# Patient Record
Sex: Female | Born: 1955 | Race: White | Hispanic: No | Marital: Married | Smoking: Current every day smoker
Health system: Southern US, Community
[De-identification: ages and names within clinical notes are randomized; demographics above are authoritative.]

## PROBLEM LIST (undated history)

## (undated) DIAGNOSIS — Z5189 Encounter for other specified aftercare: Secondary | ICD-10-CM

## (undated) DIAGNOSIS — F329 Major depressive disorder, single episode, unspecified: Secondary | ICD-10-CM

## (undated) DIAGNOSIS — M779 Enthesopathy, unspecified: Secondary | ICD-10-CM

## (undated) DIAGNOSIS — G4700 Insomnia, unspecified: Secondary | ICD-10-CM

## (undated) DIAGNOSIS — R55 Syncope and collapse: Secondary | ICD-10-CM

## (undated) DIAGNOSIS — K219 Gastro-esophageal reflux disease without esophagitis: Secondary | ICD-10-CM

## (undated) DIAGNOSIS — E785 Hyperlipidemia, unspecified: Secondary | ICD-10-CM

## (undated) DIAGNOSIS — Z86010 Personal history of colonic polyps: Secondary | ICD-10-CM

## (undated) DIAGNOSIS — F988 Other specified behavioral and emotional disorders with onset usually occurring in childhood and adolescence: Secondary | ICD-10-CM

## (undated) DIAGNOSIS — F419 Anxiety disorder, unspecified: Secondary | ICD-10-CM

## (undated) DIAGNOSIS — N611 Abscess of the breast and nipple: Secondary | ICD-10-CM

## (undated) DIAGNOSIS — I491 Atrial premature depolarization: Secondary | ICD-10-CM

## (undated) DIAGNOSIS — F32A Depression, unspecified: Secondary | ICD-10-CM

## (undated) DIAGNOSIS — H269 Unspecified cataract: Secondary | ICD-10-CM

## (undated) HISTORY — DX: Other specified behavioral and emotional disorders with onset usually occurring in childhood and adolescence: F98.8

## (undated) HISTORY — DX: Encounter for other specified aftercare: Z51.89

## (undated) HISTORY — DX: Gastro-esophageal reflux disease without esophagitis: K21.9

## (undated) HISTORY — DX: Insomnia, unspecified: G47.00

## (undated) HISTORY — DX: Atrial premature depolarization: I49.1

## (undated) HISTORY — DX: Hyperlipidemia, unspecified: E78.5

## (undated) HISTORY — DX: Depression, unspecified: F32.A

## (undated) HISTORY — PX: OTHER SURGICAL HISTORY: SHX169

## (undated) HISTORY — DX: Abscess of the breast and nipple: N61.1

## (undated) HISTORY — PX: COLONOSCOPY: SHX174

## (undated) HISTORY — DX: Anxiety disorder, unspecified: F41.9

## (undated) HISTORY — DX: Personal history of colonic polyps: Z86.010

## (undated) HISTORY — DX: Unspecified cataract: H26.9

## (undated) HISTORY — DX: Major depressive disorder, single episode, unspecified: F32.9

## (undated) HISTORY — PX: DEBRIDEMENT TENNIS ELBOW: SHX1442

## (undated) HISTORY — PX: WRIST FRACTURE SURGERY: SHX121

---
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
## 1987-09-18 HISTORY — PX: ABDOMINAL HYSTERECTOMY: SHX81

## 1999-09-18 HISTORY — PX: OTHER SURGICAL HISTORY: SHX169

## 1999-09-18 HISTORY — PX: BREAST BIOPSY: SHX20

## 2000-05-21 ENCOUNTER — Other Ambulatory Visit: Admission: RE | Admit: 2000-05-21 | Discharge: 2000-05-21 | Payer: Self-pay | Admitting: Obstetrics and Gynecology

## 2000-06-26 ENCOUNTER — Encounter: Payer: Self-pay | Admitting: Surgery

## 2000-06-26 ENCOUNTER — Encounter: Admission: RE | Admit: 2000-06-26 | Discharge: 2000-06-26 | Payer: Self-pay | Admitting: Surgery

## 2000-06-26 ENCOUNTER — Other Ambulatory Visit: Admission: RE | Admit: 2000-06-26 | Discharge: 2000-06-26 | Payer: Self-pay | Admitting: Surgery

## 2000-06-26 ENCOUNTER — Encounter (INDEPENDENT_AMBULATORY_CARE_PROVIDER_SITE_OTHER): Payer: Self-pay | Admitting: Specialist

## 2002-02-05 ENCOUNTER — Other Ambulatory Visit: Admission: RE | Admit: 2002-02-05 | Discharge: 2002-02-05 | Payer: Self-pay | Admitting: Obstetrics and Gynecology

## 2004-08-18 ENCOUNTER — Ambulatory Visit (HOSPITAL_BASED_OUTPATIENT_CLINIC_OR_DEPARTMENT_OTHER): Admission: RE | Admit: 2004-08-18 | Discharge: 2004-08-18 | Payer: Self-pay | Admitting: Orthopedic Surgery

## 2004-12-08 ENCOUNTER — Other Ambulatory Visit: Admission: RE | Admit: 2004-12-08 | Discharge: 2004-12-08 | Payer: Self-pay | Admitting: Obstetrics and Gynecology

## 2007-01-17 ENCOUNTER — Encounter: Admission: RE | Admit: 2007-01-17 | Discharge: 2007-01-17 | Payer: Self-pay | Admitting: Internal Medicine

## 2008-07-11 ENCOUNTER — Emergency Department (HOSPITAL_COMMUNITY): Admission: EM | Admit: 2008-07-11 | Discharge: 2008-07-11 | Payer: Self-pay | Admitting: Emergency Medicine

## 2010-10-08 ENCOUNTER — Encounter (HOSPITAL_BASED_OUTPATIENT_CLINIC_OR_DEPARTMENT_OTHER): Payer: Self-pay | Admitting: Internal Medicine

## 2010-10-09 ENCOUNTER — Encounter: Payer: Self-pay | Admitting: Obstetrics and Gynecology

## 2011-02-02 NOTE — Op Note (Signed)
NAMECHARLES, NIESE              ACCOUNT NO.:  [id number]   MEDICAL RECORD NO.:  [id number]          PATIENT TYPE:  AMB   LOCATION:  [facility]                          FACILITY:  [facility]   PHYSICIAN:  Harvie Junior, M.D.   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   PRINCIPAL PROCEDURE:  Lateral epicondylar release with debridement of  lateral epicondyle.   SURGEON:  Harvie Junior, M.D.   ASSISTANT:  Marshia Ly, P.A.   ANESTHESIA:  General.   BRIEF HISTORY:  She is a 55 year old female with a long history of having  lateral epicondylitis.  We treated her in the office x4 with injection  therapy.  It worked at each time, but she has had recurrence of symptoms.  Because of continued complaints of symptoms, she was ultimately taken to the  operating room for a lateral epicondylar release.   PROCEDURE:  The patient was taken to the operating room, where after an  adequate level of anesthesia was obtained with a general anesthetic, the  patient was placed on the operating table and the right arm was prepped and  draped in the usual sterile fashion.  Following this, the arm was  exsanguinated, a blood pressure tourniquet inflated to 250 mmHg.  Following  this, a curved incision was made over the lateral epicondyle, subcutaneous  tissues taken down to the level of the conjoined tendon the lateral side.  The conjoined lateral tendon was released off the elbow and an osteotome was  used to remove the bone off the lateral epicondyle.  The dissolved tendinous  portion of the extensor carpi brevis was debrided.  The lateral joint was  opened and investigated and irrigated thoroughly.  No significant pathology  was identified.  At this point the  lateral epicondylar muscle structure was  reattached to the lateral epicondyle with a running 0 Vicryl suture.  The  skin was closed with 0 and 2-0 Vicryl and 3-0 Maxon.  Benzoin and Steri-  Strips were applied and the patient was placed into a long-arm plaster and  taken to the recovery room, where she was noted to be in satisfactory  condition.  Estimated blood loss for the procedure was none.       JLG/MEDQ  D:  08/18/2004  T:  08/19/2004  Job:  [id number]

## 2011-09-05 ENCOUNTER — Encounter: Payer: Self-pay | Admitting: Internal Medicine

## 2011-09-06 ENCOUNTER — Encounter (INDEPENDENT_AMBULATORY_CARE_PROVIDER_SITE_OTHER): Payer: Self-pay | Admitting: General Surgery

## 2011-09-13 ENCOUNTER — Encounter (INDEPENDENT_AMBULATORY_CARE_PROVIDER_SITE_OTHER): Payer: Self-pay | Admitting: Surgery

## 2011-09-13 ENCOUNTER — Ambulatory Visit (INDEPENDENT_AMBULATORY_CARE_PROVIDER_SITE_OTHER): Payer: BC Managed Care – PPO | Admitting: Surgery

## 2011-09-13 VITALS — BP 118/72 | HR 60 | Temp 98.1°F | Resp 16 | Ht 68.0 in | Wt 140.8 lb

## 2011-09-13 DIAGNOSIS — N611 Abscess of the breast and nipple: Secondary | ICD-10-CM

## 2011-09-13 DIAGNOSIS — N6002 Solitary cyst of left breast: Secondary | ICD-10-CM | POA: Insufficient documentation

## 2011-09-13 DIAGNOSIS — N61 Mastitis without abscess: Secondary | ICD-10-CM

## 2011-09-13 MED ORDER — DOXYCYCLINE HYCLATE 100 MG PO TABS: 100.0000 mg | ORAL_TABLET | Freq: Two times a day (BID) | ORAL | Status: AC

## 2011-09-13 NOTE — Progress Notes (Signed)
Subjective:     Patient ID: Meredith Morgan, female   DOB: 1956/05/31, 55 y.o.   MRN: [id number]  HPI  Meredith Morgan  1956-06-13 [id number]  Patient Care Team: Garlan Fillers, MD as PCP - General (Internal Medicine) Meriel Pica, MD as Consulting Physician (Obstetrics and Gynecology)  This patient is a 55 y.o.female who presents today for surgical evaluation at the request of Dr. Eloise Harman.   Patient is a pleasant female who has left breast pain & swelling at e inframammary fold for the past month.  It became about as big as a golf ball. She saw her primary care physician. He started her on doxycycline.  Just after that she began to get some drainage. There was some confusion on getting an appointment set up with [facility]. The drainage and swelling have gone down. However the past week it has not resolved. She came in urgently to be evaluated to see something more aggressive need be done. She's completed antibiotics. The drainage has stopped. She had multiple openings but now she just has one small opening now  She's not had a history of breast problems. No history of skin infections are MRSA. No family history of breast problems or cancer. No history of trauma or fall. No scratches  Patient Active Problem List  Diagnoses  . Breast abscess of female, left inframammary fold    Past Medical History  Diagnosis Date  . GERD (gastroesophageal reflux disease)   . Breast abscess     left    Past Surgical History  Procedure Date  . Abdominal hysterectomy 1989  . Debridement tennis elbow 2007?    left    History   Social History  . Marital Status: Single    Spouse Name: N/A    Number of Children: N/A  . Years of Education: N/A   Occupational History  . Not on file.   Social History Main Topics  . Smoking status: Current Everyday Smoker -- 1.0 packs/day  . Smokeless tobacco: Not on file  . Alcohol Use: 0.0 oz/week    3-4 Glasses of wine per week     socially  . Drug  Use: No  . Sexually Active:    Other Topics Concern  . Not on file   Social History Narrative  . No narrative on file    Family History  Problem Relation Age of Onset  . COPD Mother   . Cancer Father     lung    Current outpatient prescriptions:escitalopram (LEXAPRO) 10 MG tablet, Take 10 mg by mouth daily.  , Disp: , Rfl: ;  lamoTRIgine (LAMICTAL) 200 MG tablet, Take 200 mg by mouth daily.  , Disp: , Rfl: ;  methylphenidate (RITALIN) 20 MG tablet, Take 20 mg by mouth as needed.  , Disp: , Rfl: ;  omeprazole (PRILOSEC) 20 MG capsule, Take 20 mg by mouth daily.  , Disp: , Rfl:  doxycycline (VIBRA-TABS) 100 MG tablet, Take 1 tablet (100 mg total) by mouth 2 (two) times daily., Disp: 20 tablet, Rfl: 2  Allergies  Allergen Reactions  . Penicillins Rash    BP 118/72  Pulse 60  Temp(Src) 98.1 F (36.7 C) (Temporal)  Resp 16  Ht 5\' 8"  (1.727 m)  Wt 140 lb 12.8 oz (63.866 kg)  BMI 21.41 kg/m2    Review of Systems  Constitutional: Negative for fever, chills, diaphoresis, appetite change and fatigue.  HENT: Negative for ear pain, sore throat, trouble swallowing, neck pain and  ear discharge.   Eyes: Negative for photophobia, discharge and visual disturbance.  Respiratory: Negative for cough, choking, chest tightness and shortness of breath.   Cardiovascular: Negative for chest pain and palpitations.  Gastrointestinal: Negative for nausea, vomiting, abdominal pain, diarrhea, constipation, anal bleeding and rectal pain.  Genitourinary: Negative for dysuria, frequency and difficulty urinating.  Musculoskeletal: Negative for myalgias and gait problem.  Skin: Negative for color change, pallor and rash.  Neurological: Negative for dizziness, speech difficulty, weakness and numbness.  Hematological: Negative for adenopathy.  Psychiatric/Behavioral: Negative for confusion and agitation. The patient is not nervous/anxious.        Objective:   Physical Exam  Constitutional: She is  oriented to person, place, and time. She appears well-developed and well-nourished. No distress.  HENT:  Head: Normocephalic.  Mouth/Throat: Oropharynx is clear and moist. No oropharyngeal exudate.  Eyes: Conjunctivae and EOM are normal. Pupils are equal, round, and reactive to light. No scleral icterus.  Neck: Normal range of motion. No tracheal deviation present.  Cardiovascular: Normal rate and intact distal pulses.   Pulmonary/Chest: Effort normal. No respiratory distress. She exhibits no tenderness.    Abdominal: Soft. She exhibits no distension. There is no tenderness. Hernia confirmed negative in the right inguinal area and confirmed negative in the left inguinal area.  Musculoskeletal: Normal range of motion. She exhibits no tenderness.  Lymphadenopathy:       Right: No inguinal adenopathy present.       Left: No inguinal adenopathy present.  Neurological: She is alert and oriented to person, place, and time. No cranial nerve deficit. She exhibits normal muscle tone. Coordination normal.  Skin: Skin is warm and dry. No rash noted. She is not diaphoretic.  Psychiatric: She has a normal mood and affect. Her behavior is normal.       Assessment:     Probable breast abscess, left inframammary fold, not fully resolved    Plan:     Restart p.o. antibiotics (doxycycline x10days)  Add anti-inflammatory regimen with Aleve and heat  Return to clinic in a few weeks. If it has not resolved or is worsening she may need a biopsy or something else to rule out other concerns. She felt comfortable with that. If there is a diagnosis of cancer, she may need to see one on our Breast surgeons in the group.   I hope it will not come to that.  Stop smoking to help the healing process

## 2011-09-13 NOTE — Patient Instructions (Addendum)
Mastitis / Abscess Mastitis is a breast infection that is results in pain, puffiness (swelling), redness, and warmth of the breast. Germs cause mastitis and can enter the skin through:  Breastfeeding.   Nipple piercing.   Cracks in the skin of the breast.  HOME CARE  Take all medicine as told by your doctor. An antibiotic medicine to kill the infection may be prescribed.   Keep your nipples clean and dry if you breastfeed. You may have you stop breastfeeding until the breast infection has gone away.   Do not use one breast to nurse your baby. Switch breasts when you breastfeed. Use different positions to breastfeed.   Avoid letting your breasts get overly filled with milk (engorged). Use a breast pump to empty your breasts.   Do not wear tight-fitting bras. Wear a good support bra.   A breastfeeding specialist (lactation consultant) can give you helpful tips on breastfeeding.  GET HELP RIGHT AWAY IF:  Your breast starts leaking a yellow or tan fluid.   The pain, puffiness, or redness in your breast gets worse.   You have a fever.  MAKE SURE YOU:   Understand these instructions.   Will watch your condition.   Will get help right away if you are not doing well or get worse.  Document Released: 08/22/2009 Document Revised: 05/16/2011 Document Reviewed: 08/22/2009 [facility] Patient Information 2012 [place], [place].  Smoking Cessation, Tips for Success YOU CAN QUIT SMOKING If you are ready to quit smoking, congratulations! You have chosen to help yourself be healthier. Cigarettes bring nicotine, tar, carbon monoxide, and other irritants into your body. Your lungs, heart, and blood vessels will be able to work better without these poisons. There are many different ways to quit smoking. Nicotine gum, nicotine patches, a nicotine inhaler, or nicotine nasal spray can help with physical craving. Hypnosis, support groups, and medicines help break the habit of smoking. Here are some tips  to help you quit for good.  Throw away all cigarettes.   Clean and remove all ashtrays from your home, work, and car.   On a card, write down your reasons for quitting. Carry the card with you and read it when you get the urge to smoke.   Cleanse your body of nicotine. Drink enough water and fluids to keep your urine clear or pale yellow. Do this after quitting to flush the nicotine from your body.   Learn to predict your moods. Do not let a bad situation be your excuse to have a cigarette. Some situations in your life might tempt you into wanting a cigarette.   Never have "just one" cigarette. It leads to wanting another and another. Remind yourself of your decision to quit.   Change habits associated with smoking. If you smoked while driving or when feeling stressed, try other activities to replace smoking. Stand up when drinking your coffee. Brush your teeth after eating. Sit in a different chair when you read the paper. Avoid alcohol while trying to quit, and try to drink fewer caffeinated beverages. Alcohol and caffeine may urge you to smoke.   Avoid foods and drinks that can trigger a desire to smoke, such as sugary or spicy foods and alcohol.   Ask people who smoke not to smoke around you.   Have something planned to do right after eating or having a cup of coffee. Take a walk or exercise to perk you up. This will help to keep you from overeating.   Try a relaxation  exercise to calm you down and decrease your stress. Remember, you may be tense and nervous for the first 2 weeks after you quit, but this will pass.   Find new activities to keep your hands busy. Play with a pen, coin, or rubber band. Doodle or draw things on paper.   Brush your teeth right after eating. This will help cut down on the craving for the taste of tobacco after meals. You can try mouthwash, too.   Use oral substitutes, such as lemon drops, carrots, a cinnamon stick, or chewing gum, in place of cigarettes.  Keep them handy so they are available when you have the urge to smoke.   When you have the urge to smoke, try deep breathing.   Designate your home as a nonsmoking area.   If you are a heavy smoker, ask your caregiver about a prescription for nicotine chewing gum. It can ease your withdrawal from nicotine.   Reward yourself. Set aside the cigarette money you save and buy yourself something nice.   Look for support from others. Join a support group or smoking cessation program. Ask someone at home or at work to help you with your plan to quit smoking.   Always ask yourself, "Do I need this cigarette or is this just a reflex?" Tell yourself, "Today, I choose not to smoke," or "I do not want to smoke." You are reminding yourself of your decision to quit, even if you do smoke a cigarette.  HOW WILL I FEEL WHEN I QUIT SMOKING?  The benefits of not smoking start within days of quitting.   You may have symptoms of withdrawal because your body is used to nicotine (the addictive substance in cigarettes). You may crave cigarettes, be irritable, feel very hungry, cough often, get headaches, or have difficulty concentrating.   The withdrawal symptoms are only temporary. They are strongest when you first quit but will go away within 10 to 14 days.   When withdrawal symptoms occur, stay in control. Think about your reasons for quitting. Remind yourself that these are signs that your body is healing and getting used to being without cigarettes.   Remember that withdrawal symptoms are easier to treat than the major diseases that smoking can cause.   Even after the withdrawal is over, expect periodic urges to smoke. However, these cravings are generally short-lived and will go away whether you smoke or not. Do not smoke!   If you relapse and smoke again, do not lose hope. Most smokers quit 3 times before they are successful.   If you relapse, do not give up! Plan ahead and think about what you will do  the next time you get the urge to smoke.  LIFE AS A NONSMOKER: MAKE IT FOR A MONTH, MAKE IT FOR LIFE Day 1: Hang this page where you will see it every day. Day 2: Get rid of all ashtrays, matches, and lighters. Day 3: Drink water. Breathe deeply between sips. Day 4: Avoid places with smoke-filled air, such as bars, clubs, or the smoking section of restaurants. Day 5: Keep track of how much money you save by not smoking. Day 6: Avoid boredom. Keep a good book with you or go to the movies. Day 7: Reward yourself! One week without smoking! Day 8: Make a dental appointment to get your teeth cleaned. Day 9: Decide how you will turn down a cigarette before it is offered to you. Day 10: Review your reasons for quitting. Day 11:  Distract yourself. Stay active to keep your mind off smoking and to relieve tension. Take a walk, exercise, read a book, do a crossword puzzle, or try a new hobby. Day 12: Exercise. Get off the bus before your stop or use stairs instead of escalators. Day 13: Call on friends for support and encouragement. Day 14: Reward yourself! Two weeks without smoking! Day 15: Practice deep breathing exercises. Day 16: Bet a friend that you can stay a nonsmoker. Day 17: Ask to sit in nonsmoking sections of restaurants. Day 18: Hang up "No Smoking" signs. Day 19: Think of yourself as a nonsmoker. Day 20: Each morning, tell yourself you will not smoke. Day 21: Reward yourself! Three weeks without smoking! Day 22: Think of smoking in negative ways. Remember how it stains your teeth, gives you bad breath, and leaves you short of breath. Day 23: Eat a nutritious breakfast. Day 24:Do not relive your days as a smoker. Day 25: Hold a pencil in your hand when talking on the telephone. Day 26: Tell all your friends you do not smoke. Day 27: Think about how much better food tastes. Day 28: Remember, one cigarette is one too many. Day 29: Take up a hobby that will keep your hands busy. Day 30:  Congratulations! One month without smoking! Give yourself a big reward. Your caregiver can direct you to community resources or hospitals for support, which may include:  Group support.   Education.   Hypnosis.   Subliminal therapy.  Document Released: 06/01/2004 Document Revised: 05/16/2011 Document Reviewed: 06/20/2009 [facility] Patient Information 2012 [place], [place].

## 2011-10-01 ENCOUNTER — Telehealth (INDEPENDENT_AMBULATORY_CARE_PROVIDER_SITE_OTHER): Payer: Self-pay

## 2011-10-01 NOTE — Telephone Encounter (Signed)
LMOM for pt to call me b/c I need to r/s her appt with Dr Michaell Cowing from 10-02-11 to the next day per DR Gross./ [facility]

## 2011-10-02 ENCOUNTER — Encounter (INDEPENDENT_AMBULATORY_CARE_PROVIDER_SITE_OTHER): Payer: BC Managed Care – PPO | Admitting: Surgery

## 2011-10-02 ENCOUNTER — Telehealth (INDEPENDENT_AMBULATORY_CARE_PROVIDER_SITE_OTHER): Payer: Self-pay

## 2011-10-02 NOTE — Telephone Encounter (Signed)
LMOM at home and cell that I need for pt to arrive for her appt with Dr Michaell Cowing on 10/03/11 @ 11:45 b/c DR Michaell Cowing has surgery before his clinic.

## 2011-10-03 ENCOUNTER — Encounter (INDEPENDENT_AMBULATORY_CARE_PROVIDER_SITE_OTHER): Payer: BC Managed Care – PPO | Admitting: Surgery

## 2011-10-03 ENCOUNTER — Ambulatory Visit: Payer: Self-pay | Admitting: Internal Medicine

## 2011-10-03 ENCOUNTER — Telehealth (INDEPENDENT_AMBULATORY_CARE_PROVIDER_SITE_OTHER): Payer: Self-pay

## 2011-10-03 NOTE — Telephone Encounter (Signed)
LMOM for pt stating that I did r/s her appt for next wk on 10-10-11 @8 :30 but if this is not convienent for her to have me overhead paged so I can find a time that is good for her.

## 2011-10-10 ENCOUNTER — Encounter (INDEPENDENT_AMBULATORY_CARE_PROVIDER_SITE_OTHER): Payer: Self-pay | Admitting: Surgery

## 2011-10-10 ENCOUNTER — Ambulatory Visit (INDEPENDENT_AMBULATORY_CARE_PROVIDER_SITE_OTHER): Payer: BC Managed Care – PPO | Admitting: Surgery

## 2011-10-10 VITALS — BP 102/72 | HR 62 | Temp 97.8°F | Resp 16 | Ht 68.0 in | Wt 144.0 lb

## 2011-10-10 DIAGNOSIS — N6009 Solitary cyst of unspecified breast: Secondary | ICD-10-CM

## 2011-10-10 DIAGNOSIS — N6002 Solitary cyst of left breast: Secondary | ICD-10-CM

## 2011-10-10 NOTE — Progress Notes (Signed)
Subjective:     Patient ID: Meredith Morgan, female   DOB: 1956-08-20, 56 y.o.   MRN: [id number]  HPI   ALEXAH KIVETT  08/10/1956 [id number]  Patient Care Team: Garlan Fillers, MD as PCP - General (Internal Medicine) Meriel Pica, MD as Consulting Physician (Obstetrics and Gynecology)  This patient is a 56 y.o.female who presents today for surgical evaluation at the request of Dr. Eloise Harman.   Patient is a pleasant female who has left breast pain & swelling at the inframammary fold for the past month.  It became about as big as a golf ball. She saw her primary care physician. He started her on doxycycline.  Just after that she began to get some drainage. There was some confusion on getting an appointment set up with [facility]. The drainage and swelling have gone down. However the past week it has not resolved. She came in urgently to be evaluated to see something more aggressive need be done. She's completed antibiotics. The drainage has stopped. She had multiple openings but now she just has one small opening now  She's not had a history of breast problems. No history of skin infections are MRSA. No family history of breast problems or cancer. No history of trauma or fall. No scratches.  She feels better now.  Completed antibiotics.  No more drainage.  Swelling is down.  Patient Active Problem List  Diagnoses  . Cyst of breast, left, inframammary fold    Past Medical History  Diagnosis Date  . GERD (gastroesophageal reflux disease)   . Breast abscess     left    Past Surgical History  Procedure Date  . Abdominal hysterectomy 1989  . Debridement tennis elbow 2007?    left    History   Social History  . Marital Status: Single    Spouse Name: N/A    Number of Children: N/A  . Years of Education: N/A   Occupational History  . Not on file.   Social History Main Topics  . Smoking status: Current Everyday Smoker -- 1.0 packs/day  . Smokeless tobacco: Never Used  .  Alcohol Use: 0.0 oz/week    3-4 Glasses of wine per week     socially  . Drug Use: No  . Sexually Active: Not on file   Other Topics Concern  . Not on file   Social History Narrative  . No narrative on file    Family History  Problem Relation Age of Onset  . COPD Mother   . Cancer Father     lung    Current outpatient prescriptions:escitalopram (LEXAPRO) 10 MG tablet, Take 10 mg by mouth daily.  , Disp: , Rfl: ;  lamoTRIgine (LAMICTAL) 200 MG tablet, Take 200 mg by mouth daily.  , Disp: , Rfl: ;  methylphenidate (RITALIN) 20 MG tablet, Take 20 mg by mouth as needed.  , Disp: , Rfl: ;  omeprazole (PRILOSEC) 20 MG capsule, Take 20 mg by mouth daily.  , Disp: , Rfl:   Allergies  Allergen Reactions  . Penicillins Rash    BP 102/72  Pulse 62  Temp(Src) 97.8 F (36.6 C) (Temporal)  Resp 16  Ht 5\' 8"  (1.727 m)  Wt 144 lb (65.318 kg)  BMI 21.90 kg/m2    Review of Systems  Constitutional: Negative for fever, chills, diaphoresis, appetite change and fatigue.  HENT: Negative for ear pain, sore throat, trouble swallowing, neck pain and ear discharge.   Eyes: Negative for  photophobia, discharge and visual disturbance.  Respiratory: Negative for cough, choking, chest tightness and shortness of breath.   Cardiovascular: Negative for chest pain and palpitations.  Gastrointestinal: Negative for nausea, vomiting, abdominal pain, diarrhea, constipation, anal bleeding and rectal pain.  Genitourinary: Negative for dysuria, frequency and difficulty urinating.  Musculoskeletal: Negative for myalgias and gait problem.  Skin: Negative for color change, pallor and rash.  Neurological: Negative for dizziness, speech difficulty, weakness and numbness.  Hematological: Negative for adenopathy.  Psychiatric/Behavioral: Negative for confusion and agitation. The patient is not nervous/anxious.        Objective:   Physical Exam  Constitutional: She is oriented to person, place, and time. She  appears well-developed and well-nourished. No distress.  HENT:  Head: Normocephalic.  Mouth/Throat: Oropharynx is clear and moist. No oropharyngeal exudate.  Eyes: Conjunctivae and EOM are normal. Pupils are equal, round, and reactive to light. No scleral icterus.  Neck: Normal range of motion. No tracheal deviation present.  Cardiovascular: Normal rate and intact distal pulses.   Pulmonary/Chest: Effort normal. No respiratory distress. She exhibits no tenderness.    Abdominal: Soft. She exhibits no distension. There is no tenderness. Hernia confirmed negative in the right inguinal area and confirmed negative in the left inguinal area.  Musculoskeletal: Normal range of motion. She exhibits no tenderness.  Lymphadenopathy:       Right: No inguinal adenopathy present.       Left: No inguinal adenopathy present.  Neurological: She is alert and oriented to person, place, and time. No cranial nerve deficit. She exhibits normal muscle tone. Coordination normal.  Skin: Skin is warm and dry. No rash noted. She is not diaphoretic.  Psychiatric: She has a normal mood and affect. Her behavior is normal.       Assessment:     Probable breast abscess/cyst, left inframammary fold, resolving    Plan:     Anti-inflammatory regimen with Aleve and heat  Return to clinic in a few weeks. If it has not resolved or is worsening she may need excisional biopsy or something else to rule out other concerns. She felt comfortable with that. If there is a diagnosis of cancer, she may need to see one on our Breast surgeons in the group.   I doubt it will not come to that.  D/w my partner, Dr. Magnus Ivan whom would assume care should more aggressive therapy need be involved  Stop smoking to help the healing process

## 2011-10-10 NOTE — Patient Instructions (Signed)
Breast Cyst A breast cyst is a sac in your breast that is filled with fluid. This is common in women. Women can have one or many cysts. When the breasts contain many cysts, it is usually due to a noncancerous (benign) condition called fibrocystic change. These lumps form under the influence of female hormones (estrogen and progesterone). The lumps are most often located in the upper, outer portion of the breast. They are often more swollen, painful and tender before your period starts. They usually disappear after menopause, unless you are on hormone therapy. Different types of cysts:  Macrocysts. These are cysts that are about 2 inches in diameter.   Microcysts. These are tiny cysts that you cannot feel, but that are seen with a mammogram or an ultrasound.   Galactocele. This is a cyst containing milk, which develops when and if you suddenly stop breast-feeding.   Sebaceous cyst of the skin (not in the breast tissue itself). These are not cancerous.  Breast cysts do not increase your chance of getting breast cancer. However, they must be followed and treated closely, because a cyst can be cancerous. Be sure to see your caregiver for follow up care as recommended.  CAUSES   It is not completely known what causes a breast cyst.   Estrogen may influence the development of a breast cyst.   An overgrowth of milk glands and connective tissue in the breast can block the milk glands, causing them to fill with fluid.   Scar tissue in the breast from previous surgery may block the glands, causing a cyst.  SYMPTOMS   Feeling a smooth, round, soft lump (like a grape) in the breast that is easily moveable.   Breast discomfort or pain, especially in the area of the cyst.   Increase in size of the lump before your menstrual period, and decrease in size after your menstrual period.  DIAGNOSIS   The cyst can be felt during an exam by your caregiver.   Mammogram (breast X-ray).   Ultrasound.    Removing fluid from the cyst with a needle (fine needle aspiration).  TREATMENT   Your caregiver may feel there is no reason for treatment. He/she may watch to see if it goes away on its own.   Hormone treatment.   Needle aspiration. There is a 40% chance of the cyst recurring after aspiration.   Surgery to remove the whole cyst.  HOME CARE INSTRUCTIONS   Get a yearly exam by your caregiver, including a breast exam.   Do monthly self breast exams 5 to 7 days after your menstrual period.   Get mammogram tests as directed by your caregiver.   Only take over-the-counter pain medication as directed by your caregiver.   Wear a good support bra, especially when exercising.   Avoid caffeine.   Reduce your salt intake, especially before your menstrual period. Too much salt can cause fluid retention, breast swelling, and discomfort.  SEEK MEDICAL CARE IF:   You feel, or think you feel, a lump in your breast.   You notice that both breasts look different than usual.   You notice that both breasts feel different than before.   Your breast is still causing pain, after your menstrual period is over.   You need medicine for breast pain and swelling that occurs with your menstrual period.  SEEK IMMEDIATE MEDICAL CARE IF:   You develop severe pain, tenderness, redness, or heat in your breast.   You develop nipple discharge or  bleeding.   Your breast lump becomes hard and painful.   You find new lumps or bumps that were not there before.   You feel lumps in your armpit (axilla).   You notice dimpling or wrinkling of the breast or nipple.   You have a fever.  Document Released: 09/03/2005 Document Revised: 05/16/2011 Document Reviewed: 12/24/2008 [facility] Patient Information 2012 [place], [place].

## 2011-10-16 ENCOUNTER — Encounter: Payer: Self-pay | Admitting: Internal Medicine

## 2011-10-24 ENCOUNTER — Telehealth: Payer: Self-pay | Admitting: Internal Medicine

## 2011-10-24 ENCOUNTER — Ambulatory Visit: Payer: Self-pay | Admitting: Internal Medicine

## 2011-11-06 ENCOUNTER — Encounter (INDEPENDENT_AMBULATORY_CARE_PROVIDER_SITE_OTHER): Payer: BC Managed Care – PPO | Admitting: Surgery

## 2011-11-13 ENCOUNTER — Encounter: Payer: Self-pay | Admitting: Internal Medicine

## 2011-11-13 ENCOUNTER — Ambulatory Visit (INDEPENDENT_AMBULATORY_CARE_PROVIDER_SITE_OTHER): Payer: Self-pay | Admitting: Internal Medicine

## 2011-11-13 ENCOUNTER — Other Ambulatory Visit (INDEPENDENT_AMBULATORY_CARE_PROVIDER_SITE_OTHER): Payer: Self-pay

## 2011-11-13 VITALS — BP 100/60 | HR 80 | Ht 69.0 in | Wt 141.2 lb

## 2011-11-13 DIAGNOSIS — R198 Other specified symptoms and signs involving the digestive system and abdomen: Secondary | ICD-10-CM

## 2011-11-13 DIAGNOSIS — R197 Diarrhea, unspecified: Secondary | ICD-10-CM

## 2011-11-13 DIAGNOSIS — Z1211 Encounter for screening for malignant neoplasm of colon: Secondary | ICD-10-CM

## 2011-11-13 MED ORDER — PEG-KCL-NACL-NASULF-NA ASC-C 100 G PO SOLR: 1.0000 | Freq: Once | ORAL | Status: DC

## 2011-11-13 NOTE — Progress Notes (Signed)
HISTORY OF PRESENT ILLNESS:  Meredith Morgan is a 56 y.o. female with anxiety/depression who presents today, upon referral from Dr. Eloise Harman, regarding change in bowel habits in the need for screening colonoscopy. She denies a prior history of GI problems or GI evaluations. Historically, she describes her bowels are constipated. However, over the past 2 years she's had intermittent problems with "severe diarrhea". Initially, she would have bouts of diarrhea but had no issues for several months. Over the past year, she reports bouts of diarrhea 2-3 times per week. There is urgency, but no pain. No bleeding. No weight loss. She takes up to 5 Imodium per day with some relief. The problem is not triggered by meals or stress. No particular time of day. No nocturnal component. She describes the stools as foul-smelling. There has been increased intestinal gas. No new medications or dietary change. No family history of gastrointestinal disorders. Her health record states that she has GERD and is on Prilosec. However, she tells me that she was having issues with cough and GERD was considered. TPI recommended. She is not taking this regularly. No reflux symptoms.  REVIEW OF SYSTEMS:  All non-GI ROS negative except for anxiety and depression  Past Medical History  Diagnosis Date  . GERD (gastroesophageal reflux disease)   . Breast abscess     left  . Hyperlipemia   . Insomnia   . Anxiety   . Depression   . ADD (attention deficit disorder)     Past Surgical History  Procedure Date  . Abdominal hysterectomy 1989  . Debridement tennis elbow 2007?    left  . Thyroid cyst excision 1970  . Liver laceration 1976  . Nasal stenosis repair 1976  . Breast biopsy 2001  . Lasik 2001    Social History RICHELE STRAND  reports that she has been smoking.  She has never used smokeless tobacco. She reports that she drinks alcohol. She reports that she does not use illicit drugs.  family history includes COPD  in her mother; Heart disease in an unspecified family member; and Lung cancer in her father.  Allergies  Allergen Reactions  . Penicillins Rash       PHYSICAL EXAMINATION: Vital signs: BP 100/60  Pulse 80  Ht 5\' 9"  (1.753 m)  Wt 141 lb 3.2 oz (64.048 kg)  BMI 20.85 kg/m2  Constitutional: generally well-appearing, no acute distress Psychiatric: alert and oriented x3, cooperative Eyes: extraocular movements intact, anicteric, conjunctiva pink Mouth: oral pharynx moist, no lesions Neck: supple no lymphadenopathy Cardiovascular: heart regular rate and rhythm, no murmur Lungs: clear to auscultation bilaterally Abdomen: soft, nontender, nondistended, no obvious ascites, no peritoneal signs, normal bowel sounds, no organomegaly Rectal:deferred until colonoscopy Extremities: no lower extremity edema bilaterally Skin: no lesions on visible extremities Neuro: No focal deficits.   ASSESSMENT:  #1. Change in bowel habits as manifested by intermittent diarrhea one to 2 years duration. Increased frequency with time. No alarm features such as pain, bleeding, or weight loss. #2. Colon cancer screening. Appropriate candidate without contraindication   PLAN:  #1. Tissue transglutaminase antibody and serum IgA level as a screening tool for celiac sprue #2. Schedule colonoscopy with biopsies to provide appropriate colon cancer screening and further evaluate chronic intermittent diarrhea.The nature of the procedure, as well as the risks, benefits, and alternatives were carefully and thoroughly reviewed with the patient. Ample time for discussion and questions allowed. The patient understood, was satisfied, and agreed to proceed. Movi prep prescribed. The patient instructed  on its use

## 2011-11-13 NOTE — Patient Instructions (Addendum)
You have been given a separate informational sheet regarding your tobacco use, the importance of quitting and local resources to help you quit.  Please go directly to the basement to have labs drawn.  You have been scheduled for a colonoscopy with propofol. Please follow written instructions given to you at your visit today.  Please pick up your prep kit at the pharmacy within the next 1-3 days.

## 2011-11-14 LAB — TISSUE TRANSGLUTAMINASE, IGA: Tissue Transglutaminase Ab, IgA: 2.1 U/mL (ref <20)

## 2011-11-28 ENCOUNTER — Ambulatory Visit (AMBULATORY_SURGERY_CENTER): Payer: BC Managed Care – PPO | Admitting: Internal Medicine

## 2011-11-28 ENCOUNTER — Encounter: Payer: Self-pay | Admitting: Internal Medicine

## 2011-11-28 VITALS — BP 130/65 | HR 70 | Temp 97.2°F | Resp 20

## 2011-11-28 DIAGNOSIS — K5730 Diverticulosis of large intestine without perforation or abscess without bleeding: Secondary | ICD-10-CM

## 2011-11-28 DIAGNOSIS — Z1211 Encounter for screening for malignant neoplasm of colon: Secondary | ICD-10-CM

## 2011-11-28 DIAGNOSIS — D126 Benign neoplasm of colon, unspecified: Secondary | ICD-10-CM

## 2011-11-28 DIAGNOSIS — R197 Diarrhea, unspecified: Secondary | ICD-10-CM

## 2011-11-28 MED ORDER — SODIUM CHLORIDE 0.9 % IV SOLN: 500.0000 mL | INTRAVENOUS | Status: DC

## 2011-11-28 NOTE — Progress Notes (Signed)
Propofol per l beeson crna. See scanned intra procedure report. ewm 

## 2011-11-28 NOTE — Patient Instructions (Signed)
YOU HAD AN ENDOSCOPIC PROCEDURE TODAY AT THE [facility]: Refer to the procedure report that was given to you for any specific questions about what was found during the examination.  If the procedure report does not answer your questions, please call your gastroenterologist to clarify.  If you requested that your care partner not be given the details of your procedure findings, then the procedure report has been included in a sealed envelope for you to review at your convenience later.  YOU SHOULD EXPECT: Some feelings of bloating in the abdomen. Passage of more gas than usual.  Walking can help get rid of the air that was put into your GI tract during the procedure and reduce the bloating. If you had a lower endoscopy (such as a colonoscopy or flexible sigmoidoscopy) you may notice spotting of blood in your stool or on the toilet paper. If you underwent a bowel prep for your procedure, then you may not have a normal bowel movement for a few days.  DIET: Your first meal following the procedure should be a light meal and then it is ok to progress to your normal diet.  A half-sandwich or bowl of soup is an example of a good first meal.  Heavy or fried foods are harder to digest and may make you feel nauseous or bloated.  Likewise meals heavy in dairy and vegetables can cause extra gas to form and this can also increase the bloating.  Drink plenty of fluids but you should avoid alcoholic beverages for 24 hours.  ACTIVITY: Your care partner should take you home directly after the procedure.  You should plan to take it easy, moving slowly for the rest of the day.  You can resume normal activity the day after the procedure however you should NOT DRIVE or use heavy machinery for 24 hours (because of the sedation medicines used during the test).    SYMPTOMS TO REPORT IMMEDIATELY: A gastroenterologist can be reached at any hour.  During normal business hours, 8:30 AM to 5:00 PM Monday through Friday,  call [phone].  After hours and on weekends, please call the GI answering service at [phone] who will take a message and have the physician on call contact you.   Following lower endoscopy (colonoscopy or flexible sigmoidoscopy):  Excessive amounts of blood in the stool  Significant tenderness or worsening of abdominal pains  Swelling of the abdomen that is new, acute  Fever of 100F or higher    FOLLOW UP: If any biopsies were taken you will be contacted by phone or by letter within the next 1-3 weeks.  Call your gastroenterologist if you have not heard about the biopsies in 3 weeks.  Our staff will call the home number listed on your records the next business day following your procedure to check on you and address any questions or concerns that you may have at that time regarding the information given to you following your procedure. This is a courtesy call and so if there is no answer at the home number and we have not heard from you through the emergency physician on call, we will assume that you have returned to your regular daily activities without incident.  SIGNATURES/CONFIDENTIALITY: You and/or your care partner have signed paperwork which will be entered into your electronic medical record.  These signatures attest to the fact that that the information above on your After Visit Summary has been reviewed and is understood.  Full responsibility of the confidentiality   of this discharge information lies with you and/or your care-partner.     

## 2011-11-28 NOTE — Op Note (Signed)
[facility] [address] [place], [place]  [address]  COLONOSCOPY PROCEDURE REPORT  PATIENT:  Meredith Morgan, Meredith Morgan  MR#:  [id number] BIRTHDATE:  [birth year], 55 yrs. old  GENDER:  female ENDOSCOPIST:  Wilhemina Bonito. Eda Keys, MD REF. BY:  Ivery Quale, M.D. PROCEDURE DATE:  11/28/2011 PROCEDURE:  Colonoscopy with biopsies, Colonoscopy with snare polypectomy x 2 ASA CLASS:  Class II INDICATIONS:  Routine Risk Screening, unexplained diarrhea MEDICATIONS:   MAC sedation, administered by CRNA, propofol (Diprivan) 200 mg IV  DESCRIPTION OF PROCEDURE:   After the risks benefits and alternatives of the procedure were thoroughly explained, informed consent was obtained.  Digital rectal exam was performed and revealed no abnormalities.   The LB 180AL [id number] endoscope was introduced through the anus and advanced to the cecum, which was identified by both the appendix and ileocecal valve, without limitations.  The quality of the prep was excellent, using MoviPrep.  The instrument was then slowly withdrawn as the colon was fully examined. <<PROCEDUREIMAGES>>  FINDINGS:  The terminal ileum appeared normal.  Two 2mm polyps were found in the sigmoid colon and snared without cautery. Retrieval was successful. Mild diverticulosis was found in the sigmoid colon.  Otherwise normal colonoscopy without other polyps, masses, vascular ectasias, or inflammatory changes.   Retroflexed views in the rectum revealed no abnormalities.    The time to cecum =  4:31  minutes. The scope was then withdrawn in 15:51 minutes from the cecum and the procedure completed.  COMPLICATIONS:  None  ENDOSCOPIC IMPRESSION: 1) Normal terminal ileum 2) Two tiny polyps in the sigmoid colon - removed 3) Mild diverticulosis in the sigmoid colon 4) Otherwise normal colonoscopy (random bx taken)  RECOMMENDATIONS: 1) Repeat colonoscopy in 5 years if polyp adenomatous; otherwise 10  years  ______________________________ Wilhemina Bonito. Eda Keys, MD  CC:  Jarome Matin, MD; The Patient  n. eSIGNED:   Wilhemina Bonito. Eda Keys at 11/28/2011 04:36 PM  Ilean China, [phone]

## 2011-11-28 NOTE — Progress Notes (Signed)
Patient did not experience any of the following events: a burn prior to discharge; a fall within the facility; wrong site/side/patient/procedure/implant event; or a hospital transfer or hospital admission upon discharge from the facility. (G8907) Patient did not have preoperative order for IV antibiotic SSI prophylaxis. (G8918)  

## 2011-11-29 ENCOUNTER — Telehealth: Payer: Self-pay | Admitting: *Deleted

## 2011-11-29 NOTE — Telephone Encounter (Signed)
  Follow up Call-  Call back number 11/28/2011  Post procedure Call Back phone  # [phone]  Permission to leave phone message Yes     Patient questions:  Do you have a fever, pain , or abdominal swelling? no Pain Score  0 *  Have you tolerated food without any problems? yes  Have you been able to return to your normal activities? yes  Do you have any questions about your discharge instructions: Diet   no Medications  no Follow up visit  no  Do you have questions or concerns about your Care? no  Actions: * If pain score is 4 or above: No action needed, pain <4.

## 2011-12-04 ENCOUNTER — Encounter: Payer: Self-pay | Admitting: Internal Medicine

## 2011-12-12 ENCOUNTER — Encounter (INDEPENDENT_AMBULATORY_CARE_PROVIDER_SITE_OTHER): Payer: Self-pay | Admitting: Surgery

## 2011-12-12 ENCOUNTER — Ambulatory Visit (INDEPENDENT_AMBULATORY_CARE_PROVIDER_SITE_OTHER): Payer: BC Managed Care – PPO | Admitting: Surgery

## 2011-12-12 VITALS — BP 96/64 | HR 66 | Temp 97.4°F | Resp 18 | Ht 69.0 in | Wt 139.6 lb

## 2011-12-12 DIAGNOSIS — F988 Other specified behavioral and emotional disorders with onset usually occurring in childhood and adolescence: Secondary | ICD-10-CM | POA: Insufficient documentation

## 2011-12-12 DIAGNOSIS — F419 Anxiety disorder, unspecified: Secondary | ICD-10-CM | POA: Insufficient documentation

## 2011-12-12 DIAGNOSIS — Z720 Tobacco use: Secondary | ICD-10-CM

## 2011-12-12 DIAGNOSIS — F17200 Nicotine dependence, unspecified, uncomplicated: Secondary | ICD-10-CM

## 2011-12-12 DIAGNOSIS — N6009 Solitary cyst of unspecified breast: Secondary | ICD-10-CM

## 2011-12-12 DIAGNOSIS — N6002 Solitary cyst of left breast: Secondary | ICD-10-CM

## 2011-12-12 DIAGNOSIS — Z860100 Personal history of colon polyps, unspecified: Secondary | ICD-10-CM

## 2011-12-12 DIAGNOSIS — G4700 Insomnia, unspecified: Secondary | ICD-10-CM | POA: Insufficient documentation

## 2011-12-12 DIAGNOSIS — K219 Gastro-esophageal reflux disease without esophagitis: Secondary | ICD-10-CM | POA: Insufficient documentation

## 2011-12-12 HISTORY — DX: Personal history of colon polyps, unspecified: Z86.0100

## 2011-12-12 HISTORY — DX: Personal history of colonic polyps: Z86.010

## 2011-12-12 NOTE — Progress Notes (Signed)
Subjective:     Patient ID: Meredith Morgan, female   DOB: July 03, 1956, 56 y.o.   MRN: [id number]  HPI   Meredith Morgan  [birth year] [id number]  Patient Care Team: Garlan Fillers, MD as PCP - General (Internal Medicine) Meriel Pica, MD as Consulting Physician (Obstetrics and Gynecology)  This patient is a 56 y.o.female who presents today for surgical evaluation at the request of Dr. Eloise Harman.   Patient is a pleasant female who has left breast pain & swelling at the inframammary fold for the past 3 months.  It became about as big as a golf ball. She saw her primary care physician. He started her on doxycycline.  Just after that she began to get some drainage. There was some confusion on getting an appointment set up with [facility]. The drainage and swelling have gone down. However the past week it has not resolved. She came in urgently to be evaluated to see something more aggressive need be done. She's completed antibiotics. The drainage has stopped. She had multiple openings but now she just has one small opening now  She's not had a history of breast problems. No history of skin infections are MRSA. No family history of breast problems or cancer. No history of trauma or fall. No scratches.  She feels better now.  Completed antibiotics.  No more drainage.  Swelling is down,  But the area has not completely healed.  Forms a head.  No other new lesions.  No new lumps.  Patient Active Problem List  Diagnoses  . Cyst of breast, left, inframammary fold    Past Medical History  Diagnosis Date  . GERD (gastroesophageal reflux disease)   . Breast abscess     left  . Hyperlipemia   . Insomnia   . Anxiety   . Depression   . ADD (attention deficit disorder)     Past Surgical History  Procedure Date  . Abdominal hysterectomy 1989  . Debridement tennis elbow 2007?    left  . Thyroid cyst excision 1970  . Liver laceration 1976  . Nasal stenosis repair 1976  . Breast biopsy 2001    . Lasik 2001    History   Social History  . Marital Status: Single    Spouse Name: N/A    Number of Children: 2  . Years of Education: N/A   Occupational History  . administrator    Social History Main Topics  . Smoking status: Current Everyday Smoker -- 1.0 packs/day  . Smokeless tobacco: Never Used  . Alcohol Use: 0.0 oz/week    3-4 Glasses of wine per week     socially  . Drug Use: No  . Sexually Active: Not on file   Other Topics Concern  . Not on file   Social History Narrative  . No narrative on file    Family History  Problem Relation Age of Onset  . COPD Mother   . Lung cancer Father   . Heart disease      Current outpatient prescriptions:escitalopram (LEXAPRO) 10 MG tablet, Take 10 mg by mouth daily. As needed qhs, Disp: , Rfl: ;  lamoTRIgine (LAMICTAL) 200 MG tablet, Take 200 mg by mouth daily.  , Disp: , Rfl: ;  methylphenidate (RITALIN) 20 MG tablet, Take 20 mg by mouth as needed.  , Disp: , Rfl:   Allergies  Allergen Reactions  . Penicillins Rash    BP 96/64  Pulse 66  Temp(Src) 97.4 F (36.3 C) (  Temporal)  Resp 18  Ht 5\' 9"  (1.753 m)  Wt 139 lb 9.6 oz (63.322 kg)  BMI 20.62 kg/m2    Review of Systems  Constitutional: Negative for fever, chills, diaphoresis, appetite change and fatigue.  HENT: Negative for ear pain, sore throat, trouble swallowing, neck pain and ear discharge.   Eyes: Negative for photophobia, discharge and visual disturbance.  Respiratory: Negative for cough, choking, chest tightness and shortness of breath.   Cardiovascular: Negative for chest pain and palpitations.  Gastrointestinal: Negative for nausea, vomiting, abdominal pain, diarrhea, constipation, anal bleeding and rectal pain.  Genitourinary: Negative for dysuria, frequency and difficulty urinating.  Musculoskeletal: Negative for myalgias and gait problem.  Skin: Negative for color change, pallor and rash.  Neurological: Negative for dizziness, speech  difficulty, weakness and numbness.  Hematological: Negative for adenopathy.  Psychiatric/Behavioral: Negative for confusion and agitation. The patient is not nervous/anxious.        Objective:   Physical Exam  Constitutional: She is oriented to person, place, and time. She appears well-developed and well-nourished. No distress.  HENT:  Head: Normocephalic.  Mouth/Throat: Oropharynx is clear and moist. No oropharyngeal exudate.  Eyes: Conjunctivae and EOM are normal. Pupils are equal, round, and reactive to light. No scleral icterus.  Neck: Normal range of motion. No tracheal deviation present.  Cardiovascular: Normal rate and intact distal pulses.   Pulmonary/Chest: Effort normal. No respiratory distress. She exhibits no tenderness.    Abdominal: Soft. She exhibits no distension. There is no tenderness. Hernia confirmed negative in the right inguinal area and confirmed negative in the left inguinal area.  Musculoskeletal: Normal range of motion. She exhibits no tenderness.  Lymphadenopathy:       Right: No inguinal adenopathy present.       Left: No inguinal adenopathy present.  Neurological: She is alert and oriented to person, place, and time. No cranial nerve deficit. She exhibits normal muscle tone. Coordination normal.  Skin: Skin is warm and dry. No rash noted. She is not diaphoretic.  Psychiatric: She has a normal mood and affect. Her behavior is normal.       Assessment:     Probable breast abscess/cyst, left inframammary fold, persistent    Plan:  We talked to the patient about the dangers of smoking.  We stressed that tobacco use dramatically increases the risk of peri-operative complications such as infection, tissue necrosis leaving to problems with incision/wound and organ healing, heart attack, stroke, DVT, pulmonary embolism, and death.  We noted there are programs in our community to help stop smoking.  Stop smoking to help the healing process  At this point,  because areas not fully resolved I think it requires removal. It is rather superficial. I hope it is only a cyst.  I think this can be done in an outpatient setting, have sedation available since it overlies a rib and may be deeper than noted on exam. Should there be a cancer involved, I will discuss with the breast surgeons such as Dr. Rayburn Ma to assume care. However, it seems most consistent with a benign process.  The pathophysiology of skin & subcutaneous masses was discussed.  Natural history risks without surgery were discussed.  I recommended surgery to remove the mass.  I explained the technique of removal with use of local anesthesia & possible need for more aggressive sedation/anesthesia for patient comfort.    Risks such as bleeding, infection, heart attack, death, and other risks were discussed.  I noted a good likelihood this  will help address the problem.   Possibility that this will not correct all symptoms was explained. Possibility of regrowth/recurrence of the mass was discussed.  We will work to minimize complications. Questions were answered.  The patient expresses understanding & wishes to proceed with surgery.

## 2011-12-12 NOTE — Patient Instructions (Signed)
Breast Cyst A breast cyst is a sac in your breast that is filled with fluid. This is common in women. Women can have one or many cysts. When the breasts contain many cysts, it is usually due to a noncancerous (benign) condition called fibrocystic change. These lumps form under the influence of female hormones (estrogen and progesterone). The lumps are most often located in the upper, outer portion of the breast. They are often more swollen, painful, and tender before your period starts. They usually disappear after menopause, unless you are on hormone therapy. Different types of cysts:  Macrocysts. These are cysts that are about 2 inches (5.1 cm) in diameter.   Microcysts. These are tiny cysts that you cannot feel, but that are seen with a mammogram or an ultrasound.   Galactocele. This is a cyst containing milk, which develops when and if you suddenly stop breastfeeding.   Sebaceous cyst of the skin (not in the breast tissue itself). These are not cancerous.  Breast cysts do not increase your chance of getting breast cancer. However, they must be followed and treated closely, because a cyst can be cancerous. Be sure to see your caregiver for follow-up care as recommended.  CAUSES   It is not completely known what causes a breast cyst.   Estrogen may influence the development of a breast cyst.   An overgrowth of milk glands and connective tissue in the breast can block the milk glands, causing them to fill with fluid.   Scar tissue in the breast from previous surgery may block the glands, causing a cyst.  SYMPTOMS   Feeling a smooth, round, soft lump (like a grape) in the breast that is easily moveable.   Breast discomfort or pain, especially in the area of the cyst.   Increase in size of the lump before your menstrual period, and decrease in size after your menstrual period.  DIAGNOSIS   The cyst can be felt during an exam by your caregiver.   Mammogram (breast X-ray).    Ultrasound.   Removing fluid from the cyst with a needle (fine needle aspiration).  TREATMENT   Your caregiver may feel there is no reason for treatment. He or she may watch to see if it goes away on its own.   Hormone treatment.   Needle aspiration. There is a 40% chance of the cyst recurring after aspiration.   Surgery to remove the whole cyst.  HOME CARE INSTRUCTIONS   Get a yearly exam by your caregiver.   Practice "breast self-awareness." This means understanding the normal appearance and feel of your breasts and may include breast self-examination.   Have a clinical breast exam (CBE) by a caregiver every 1 to 3 years if you are 20 to 56 years of age. After age 40, you should have a CBE every year.   Get mammogram tests as directed by your caregiver.   Only take over-the-counter pain medicine as directed by your caregiver.   Wear a good support bra, especially when exercising.   Avoid caffeine.   Reduce your salt intake, especially before your menstrual period. Too much salt can cause fluid retention, breast swelling, and discomfort.  SEEK MEDICAL CARE IF:   You feel, or think you feel, a lump in your breast.   You notice that both breasts look different than usual.   You notice that both breasts feel different than before.   Your breast is still causing pain, after your menstrual period is over.     You need medicine for breast pain and swelling that occurs with your menstrual period.  SEEK IMMEDIATE MEDICAL CARE IF:   You develop severe pain, tenderness, redness, or warmth in your breast.   You develop nipple discharge or bleeding.   Your breast lump becomes hard and painful.   You find new lumps or bumps that were not there before.   You feel lumps in your armpit (axilla).   You notice dimpling or wrinkling of the breast or nipple.   You have a fever.  Document Released: 09/03/2005 Document Revised: 08/23/2011 Document Reviewed: 12/24/2008 [facility]  Patient Information 2012 [place], [place].  Smoking Cessation, Tips for Success YOU CAN QUIT SMOKING If you are ready to quit smoking, congratulations! You have chosen to help yourself be healthier. Cigarettes bring nicotine, tar, carbon monoxide, and other irritants into your body. Your lungs, heart, and blood vessels will be able to work better without these poisons. There are many different ways to quit smoking. Nicotine gum, nicotine patches, a nicotine inhaler, or nicotine nasal spray can help with physical craving. Hypnosis, support groups, and medicines help break the habit of smoking. Here are some tips to help you quit for good.  Throw away all cigarettes.   Clean and remove all ashtrays from your home, work, and car.   On a card, write down your reasons for quitting. Carry the card with you and read it when you get the urge to smoke.   Cleanse your body of nicotine. Drink enough water and fluids to keep your urine clear or pale yellow. Do this after quitting to flush the nicotine from your body.   Learn to predict your moods. Do not let a bad situation be your excuse to have a cigarette. Some situations in your life might tempt you into wanting a cigarette.   Never have "just one" cigarette. It leads to wanting another and another. Remind yourself of your decision to quit.   Change habits associated with smoking. If you smoked while driving or when feeling stressed, try other activities to replace smoking. Stand up when drinking your coffee. Brush your teeth after eating. Sit in a different chair when you read the paper. Avoid alcohol while trying to quit, and try to drink fewer caffeinated beverages. Alcohol and caffeine may urge you to smoke.   Avoid foods and drinks that can trigger a desire to smoke, such as sugary or spicy foods and alcohol.   Ask people who smoke not to smoke around you.   Have something planned to do right after eating or having a cup of coffee. Take a walk or  exercise to perk you up. This will help to keep you from overeating.   Try a relaxation exercise to calm you down and decrease your stress. Remember, you may be tense and nervous for the first 2 weeks after you quit, but this will pass.   Find new activities to keep your hands busy. Play with a pen, coin, or rubber band. Doodle or draw things on paper.   Brush your teeth right after eating. This will help cut down on the craving for the taste of tobacco after meals. You can try mouthwash, too.   Use oral substitutes, such as lemon drops, carrots, a cinnamon stick, or chewing gum, in place of cigarettes. Keep them handy so they are available when you have the urge to smoke.   When you have the urge to smoke, try deep breathing.   Designate your home as  a nonsmoking area.   If you are a heavy smoker, ask your caregiver about a prescription for nicotine chewing gum. It can ease your withdrawal from nicotine.   Reward yourself. Set aside the cigarette money you save and buy yourself something nice.   Look for support from others. Join a support group or smoking cessation program. Ask someone at home or at work to help you with your plan to quit smoking.   Always ask yourself, "Do I need this cigarette or is this just a reflex?" Tell yourself, "Today, I choose not to smoke," or "I do not want to smoke." You are reminding yourself of your decision to quit, even if you do smoke a cigarette.  HOW WILL I FEEL WHEN I QUIT SMOKING?  The benefits of not smoking start within days of quitting.   You may have symptoms of withdrawal because your body is used to nicotine (the addictive substance in cigarettes). You may crave cigarettes, be irritable, feel very hungry, cough often, get headaches, or have difficulty concentrating.   The withdrawal symptoms are only temporary. They are strongest when you first quit but will go away within 10 to 14 days.   When withdrawal symptoms occur, stay in control.  Think about your reasons for quitting. Remind yourself that these are signs that your body is healing and getting used to being without cigarettes.   Remember that withdrawal symptoms are easier to treat than the major diseases that smoking can cause.   Even after the withdrawal is over, expect periodic urges to smoke. However, these cravings are generally short-lived and will go away whether you smoke or not. Do not smoke!   If you relapse and smoke again, do not lose hope. Most smokers quit 3 times before they are successful.   If you relapse, do not give up! Plan ahead and think about what you will do the next time you get the urge to smoke.  LIFE AS A NONSMOKER: MAKE IT FOR A MONTH, MAKE IT FOR LIFE Day 1: Hang this page where you will see it every day. Day 2: Get rid of all ashtrays, matches, and lighters. Day 3: Drink water. Breathe deeply between sips. Day 4: Avoid places with smoke-filled air, such as bars, clubs, or the smoking section of restaurants. Day 5: Keep track of how much money you save by not smoking. Day 6: Avoid boredom. Keep a good book with you or go to the movies. Day 7: Reward yourself! One week without smoking! Day 8: Make a dental appointment to get your teeth cleaned. Day 9: Decide how you will turn down a cigarette before it is offered to you. Day 10: Review your reasons for quitting. Day 11: Distract yourself. Stay active to keep your mind off smoking and to relieve tension. Take a walk, exercise, read a book, do a crossword puzzle, or try a new hobby. Day 12: Exercise. Get off the bus before your stop or use stairs instead of escalators. Day 13: Call on friends for support and encouragement. Day 14: Reward yourself! Two weeks without smoking! Day 15: Practice deep breathing exercises. Day 16: Bet a friend that you can stay a nonsmoker. Day 17: Ask to sit in nonsmoking sections of restaurants. Day 18: Hang up "No Smoking" signs. Day 19: Think of yourself as a  nonsmoker. Day 20: Each morning, tell yourself you will not smoke. Day 21: Reward yourself! Three weeks without smoking! Day 22: Think of smoking in negative ways. Remember  how it stains your teeth, gives you bad breath, and leaves you short of breath. Day 23: Eat a nutritious breakfast. Day 24:Do not relive your days as a smoker. Day 25: Hold a pencil in your hand when talking on the telephone. Day 26: Tell all your friends you do not smoke. Day 27: Think about how much better food tastes. Day 28: Remember, one cigarette is one too many. Day 29: Take up a hobby that will keep your hands busy. Day 30: Congratulations! One month without smoking! Give yourself a big reward. Your caregiver can direct you to community resources or hospitals for support, which may include:  Group support.   Education.   Hypnosis.   Subliminal therapy.  Document Released: 06/01/2004 Document Revised: 08/23/2011 Document Reviewed: 06/20/2009 [facility] Patient Information 2012 [place], [place].

## 2012-01-15 ENCOUNTER — Other Ambulatory Visit (INDEPENDENT_AMBULATORY_CARE_PROVIDER_SITE_OTHER): Payer: Self-pay | Admitting: Surgery

## 2012-01-15 DIAGNOSIS — N6039 Fibrosclerosis of unspecified breast: Secondary | ICD-10-CM

## 2012-01-17 ENCOUNTER — Telehealth (INDEPENDENT_AMBULATORY_CARE_PROVIDER_SITE_OTHER): Payer: Self-pay

## 2012-01-17 NOTE — Telephone Encounter (Signed)
Called pt  LMOM notify her that her path report came back showing no malignancy it was a breast cyst per Dr Michaell Cowing.

## 2012-01-30 ENCOUNTER — Ambulatory Visit (INDEPENDENT_AMBULATORY_CARE_PROVIDER_SITE_OTHER): Payer: BC Managed Care – PPO | Admitting: Surgery

## 2012-01-30 ENCOUNTER — Encounter (INDEPENDENT_AMBULATORY_CARE_PROVIDER_SITE_OTHER): Payer: Self-pay | Admitting: Surgery

## 2012-01-30 VITALS — BP 118/70 | HR 72 | Temp 98.0°F | Resp 18 | Ht 69.0 in | Wt 139.0 lb

## 2012-01-30 DIAGNOSIS — N6002 Solitary cyst of left breast: Secondary | ICD-10-CM

## 2012-01-30 DIAGNOSIS — N6009 Solitary cyst of unspecified breast: Secondary | ICD-10-CM

## 2012-01-30 NOTE — Progress Notes (Signed)
Subjective:     Patient ID: Meredith Morgan, female   DOB: 1956-05-31, 56 y.o.   MRN: [id number]  HPI   Meredith Morgan  11/21/1955 [id number]  Patient Care Team: Garlan Fillers, MD as PCP - General (Internal Medicine) Meriel Pica, MD as Consulting Physician (Obstetrics and Gynecology)  This patient is a 56 y.o.female who presents today for surgical evaluation at the request of Dr. Eloise Harman.   Procedure: Removal of mass at inframammary fold of left breast 01/15/2012  Pathology: Inclusion cyst. No cancer.  The patient comes in today feeling well..  She feels like the area has healed well and is looking. No pain. No fevers or chills. Back to regular activity.  Patient Active Problem List  Diagnoses  . Tobacco abuse  . GERD (gastroesophageal reflux disease)  . Insomnia  . Anxiety  . ADD (attention deficit disorder)  . Personal history of colonic polyps - hyperplastic only on colonoscopy [id number]    Past Medical History  Diagnosis Date  . GERD (gastroesophageal reflux disease)   . Breast abscess     left  . Hyperlipemia   . Insomnia   . Anxiety   . Depression   . ADD (attention deficit disorder)   . Personal history of colonic polyps - hyperplastic only on colonoscopy [id number] 12/12/2011    Past Surgical History  Procedure Date  . Abdominal hysterectomy 1989  . Debridement tennis elbow 2007?    left  . Thyroid cyst excision 1970  . Liver laceration 1976  . Nasal stenosis repair 1976  . Breast biopsy 2001  . Lasik 2001    History   Social History  . Marital Status: Single    Spouse Name: N/A    Number of Children: 2  . Years of Education: N/A   Occupational History  . administrator    Social History Main Topics  . Smoking status: Current Everyday Smoker -- 1.0 packs/day  . Smokeless tobacco: Never Used  . Alcohol Use: 0.0 oz/week    3-4 Glasses of wine per week     socially  . Drug Use: No  . Sexually Active: Not on file   Other Topics  Concern  . Not on file   Social History Narrative  . No narrative on file    Family History  Problem Relation Age of Onset  . COPD Mother   . Lung cancer Father   . Heart disease      Current outpatient prescriptions:escitalopram (LEXAPRO) 10 MG tablet, Take 10 mg by mouth daily. As needed qhs, Disp: , Rfl: ;  lamoTRIgine (LAMICTAL) 200 MG tablet, Take 200 mg by mouth daily.  , Disp: , Rfl: ;  methylphenidate (RITALIN) 20 MG tablet, Take 20 mg by mouth as needed.  , Disp: , Rfl: ;  DISCONTD: omeprazole (PRILOSEC) 20 MG capsule, Take 20 mg by mouth daily.  , Disp: , Rfl:   Allergies  Allergen Reactions  . Penicillins Rash    BP 118/70  Pulse 72  Temp(Src) 98 F (36.7 C) (Temporal)  Resp 18  Ht 5\' 9"  (1.753 m)  Wt 139 lb (63.05 kg)  BMI 20.53 kg/m2    Review of Systems  Constitutional: Negative for fever, chills, diaphoresis, appetite change and fatigue.  HENT: Negative for ear pain, sore throat, trouble swallowing, neck pain and ear discharge.   Eyes: Negative for photophobia, discharge and visual disturbance.  Respiratory: Negative for cough, choking, chest tightness and shortness of breath.   Cardiovascular:  Negative for chest pain and palpitations.  Gastrointestinal: Negative for nausea, vomiting, abdominal pain, diarrhea, constipation, anal bleeding and rectal pain.  Genitourinary: Negative for dysuria, frequency and difficulty urinating.  Musculoskeletal: Negative for myalgias and gait problem.  Skin: Negative for color change, pallor and rash.  Neurological: Negative for dizziness, speech difficulty, weakness and numbness.  Hematological: Negative for adenopathy.  Psychiatric/Behavioral: Negative for confusion and agitation. The patient is not nervous/anxious.        Objective:   Physical Exam  Constitutional: She is oriented to person, place, and time. She appears well-developed and well-nourished. No distress.  HENT:  Head: Normocephalic.  Mouth/Throat:  Oropharynx is clear and moist. No oropharyngeal exudate.  Eyes: Conjunctivae and EOM are normal. Pupils are equal, round, and reactive to light. No scleral icterus.  Neck: Normal range of motion. No tracheal deviation present.  Cardiovascular: Normal rate and intact distal pulses.   Pulmonary/Chest: Effort normal. No respiratory distress. She exhibits no tenderness.    Abdominal: Soft. She exhibits no distension. There is no tenderness. Hernia confirmed negative in the right inguinal area and confirmed negative in the left inguinal area.  Musculoskeletal: Normal range of motion. She exhibits no tenderness.  Lymphadenopathy:       Right: No inguinal adenopathy present.       Left: No inguinal adenopathy present.  Neurological: She is alert and oriented to person, place, and time. No cranial nerve deficit. She exhibits normal muscle tone. Coordination normal.  Skin: Skin is warm and dry. No rash noted. She is not diaphoretic.  Psychiatric: She has a normal mood and affect. Her behavior is normal.       Assessment:     Breast cyst, left inframammary fold, removed    Plan:  Increase activity as tolerated.  Do not push through pain.  Continue regular mammograms. No evidence of malignancy  Return to clinic p.r.n. The patient expressed understanding and appreciation

## 2014-10-06 ENCOUNTER — Other Ambulatory Visit: Payer: Self-pay | Admitting: Internal Medicine

## 2014-10-06 DIAGNOSIS — R609 Edema, unspecified: Secondary | ICD-10-CM

## 2014-10-06 DIAGNOSIS — H5789 Other specified disorders of eye and adnexa: Secondary | ICD-10-CM

## 2014-10-08 ENCOUNTER — Inpatient Hospital Stay
Admission: RE | Admit: 2014-10-08 | Discharge: 2014-10-08 | Disposition: A | Discharge status: Home / Self Care | Payer: Self-pay | Source: Ambulatory Visit | Attending: Internal Medicine | Admitting: Internal Medicine

## 2014-10-11 ENCOUNTER — Ambulatory Visit
Admission: RE | Admit: 2014-10-11 | Discharge: 2014-10-11 | Disposition: A | Discharge status: Home / Self Care | Payer: BLUE CROSS/BLUE SHIELD | Source: Ambulatory Visit | Attending: Internal Medicine | Admitting: Internal Medicine

## 2014-10-11 DIAGNOSIS — R609 Edema, unspecified: Secondary | ICD-10-CM

## 2014-10-11 DIAGNOSIS — H5789 Other specified disorders of eye and adnexa: Secondary | ICD-10-CM

## 2014-10-11 MED ORDER — IOHEXOL 300 MG/ML  SOLN
75.0000 mL | Freq: Once | INTRAMUSCULAR | Status: AC | PRN
  Administered 2014-10-11: 75 mL via INTRAVENOUS

## 2015-03-03 ENCOUNTER — Emergency Department (HOSPITAL_COMMUNITY)
Admission: EM | Admit: 2015-03-03 | Discharge: 2015-03-03 | Disposition: A | Discharge status: Home / Self Care | Payer: BLUE CROSS/BLUE SHIELD | Attending: Emergency Medicine | Admitting: Emergency Medicine

## 2015-03-03 ENCOUNTER — Emergency Department (HOSPITAL_COMMUNITY): Payer: BLUE CROSS/BLUE SHIELD

## 2015-03-03 ENCOUNTER — Encounter (HOSPITAL_COMMUNITY): Payer: Self-pay | Admitting: Nurse Practitioner

## 2015-03-03 DIAGNOSIS — Z8719 Personal history of other diseases of the digestive system: Secondary | ICD-10-CM | POA: Diagnosis not present

## 2015-03-03 DIAGNOSIS — M25552 Pain in left hip: Secondary | ICD-10-CM | POA: Diagnosis not present

## 2015-03-03 DIAGNOSIS — Z880 Allergy status to penicillin: Secondary | ICD-10-CM | POA: Insufficient documentation

## 2015-03-03 DIAGNOSIS — Z86010 Personal history of colonic polyps: Secondary | ICD-10-CM | POA: Insufficient documentation

## 2015-03-03 DIAGNOSIS — F419 Anxiety disorder, unspecified: Secondary | ICD-10-CM | POA: Insufficient documentation

## 2015-03-03 DIAGNOSIS — E785 Hyperlipidemia, unspecified: Secondary | ICD-10-CM | POA: Insufficient documentation

## 2015-03-03 DIAGNOSIS — Z79899 Other long term (current) drug therapy: Secondary | ICD-10-CM | POA: Insufficient documentation

## 2015-03-03 DIAGNOSIS — Z8742 Personal history of other diseases of the female genital tract: Secondary | ICD-10-CM | POA: Insufficient documentation

## 2015-03-03 DIAGNOSIS — Z720 Tobacco use: Secondary | ICD-10-CM | POA: Diagnosis not present

## 2015-03-03 DIAGNOSIS — F329 Major depressive disorder, single episode, unspecified: Secondary | ICD-10-CM | POA: Insufficient documentation

## 2015-03-03 DIAGNOSIS — M545 Low back pain: Secondary | ICD-10-CM | POA: Insufficient documentation

## 2015-03-03 DIAGNOSIS — Z8669 Personal history of other diseases of the nervous system and sense organs: Secondary | ICD-10-CM | POA: Diagnosis not present

## 2015-03-03 DIAGNOSIS — F909 Attention-deficit hyperactivity disorder, unspecified type: Secondary | ICD-10-CM | POA: Insufficient documentation

## 2015-03-03 HISTORY — DX: Enthesopathy, unspecified: M77.9

## 2015-03-03 MED ORDER — CYCLOBENZAPRINE HCL 10 MG PO TABS
10.0000 mg | ORAL_TABLET | Freq: Once | ORAL | Status: AC
Start: 2015-03-03 — End: 2015-03-03
  Administered 2015-03-03: 10 mg via ORAL
  Filled 2015-03-03: qty 1

## 2015-03-03 MED ORDER — OXYCODONE-ACETAMINOPHEN 5-325 MG PO TABS
1.0000 | ORAL_TABLET | Freq: Once | ORAL | Status: AC
  Administered 2015-03-03: 1 via ORAL
  Filled 2015-03-03: qty 1

## 2015-03-03 MED ORDER — OXYCODONE-ACETAMINOPHEN 5-325 MG PO TABS
1.0000 | ORAL_TABLET | ORAL | Status: DC | PRN
Start: 2015-03-03 — End: 2016-09-15

## 2015-03-03 MED ORDER — CYCLOBENZAPRINE HCL 10 MG PO TABS: 10.0000 mg | ORAL_TABLET | Freq: Two times a day (BID) | ORAL | Status: DC | PRN

## 2015-03-03 MED ORDER — IBUPROFEN 800 MG PO TABS
800.0000 mg | ORAL_TABLET | Freq: Once | ORAL | Status: AC
  Administered 2015-03-03: 800 mg via ORAL
  Filled 2015-03-03: qty 1

## 2015-03-03 NOTE — ED Notes (Signed)
Pt is c/o hip pain that she localizes the source to be her lumber back, states it radiates to left leg sharp, constant dull pain affecting her whole left leg that is not alleviated by anything, she states she had an x-ray done yesterday, showed arthritis, remarks on his of sciatic nerve pain and today's pain is unbearable.

## 2015-03-03 NOTE — ED Provider Notes (Signed)
CSN: [id number]     Arrival date & time 03/03/15  0105 History   First MD Initiated Contact with Patient 03/03/15 0457     Chief Complaint  Patient presents with  . Hip Pain  . Leg Pain     (Consider location/radiation/quality/duration/timing/severity/associated sxs/prior Treatment) Patient is a 59 y.o. female presenting with hip pain and leg pain. The history is provided by the patient. No language interpreter was used.  Hip Pain This is a new problem. Pertinent negatives include no chills, fever, nausea or numbness. Associated symptoms comments: She reports two falls in the last 2 weeks, the last one 5 days ago. She had some soreness but reports onset of pain in the low back and left hip 3 days ago without further injury that has been constant and uncontrolled. No bowel/bladder incontinence. She has had back and hip x-rays and reports them as negative. No numbness or weakness of the lower extremities. .  Leg Pain Associated symptoms: back pain   Associated symptoms: no fever     Past Medical History  Diagnosis Date  . GERD (gastroesophageal reflux disease)   . Breast abscess     left  . Hyperlipemia   . Insomnia   . Anxiety   . Depression   . ADD (attention deficit disorder)   . Personal history of colonic polyps - hyperplastic only on colonoscopy [id number] 12/12/2011  . Tendonitis    Past Surgical History  Procedure Laterality Date  . Abdominal hysterectomy  1989  . Debridement tennis elbow  2007?    left  . Thyroid cyst excision  1970  . Liver laceration  1976  . Nasal stenosis repair  1976  . Breast biopsy  2001  . Lasik  2001   Family History  Problem Relation Age of Onset  . COPD Mother   . Lung cancer Father   . Heart disease     History  Substance Use Topics  . Smoking status: Current Every Day Smoker -- 12.00 packs/day    Types: Cigarettes  . Smokeless tobacco: Never Used  . Alcohol Use: 0.0 oz/week    3-4 Glasses of wine per week     Comment: socially    OB History    No data available     Review of Systems  Constitutional: Negative for fever and chills.  Gastrointestinal: Negative.  Negative for nausea.  Musculoskeletal: Positive for back pain.  Skin: Negative.   Neurological: Negative.  Negative for numbness.      Allergies  Penicillins  Home Medications   Prior to Admission medications   Medication Sig Start Date End Date Taking? Authorizing Provider  ALPRAZolam Duanne Moron) 0.5 MG tablet Take 0.5 mg by mouth at bedtime as needed for anxiety.   Yes Historical Provider, MD  CRESTOR 20 MG tablet Take 20 mg by mouth daily. 02/04/15  Yes Historical Provider, MD  escitalopram (LEXAPRO) 10 MG tablet Take 10 mg by mouth daily.    Yes Historical Provider, MD  lamoTRIgine (LAMICTAL) 200 MG tablet Take 200 mg by mouth daily.     Yes Historical Provider, MD  methylphenidate (RITALIN) 20 MG tablet Take 20 mg by mouth as needed (to help focus).    Yes Historical Provider, MD   BP 112/57 mmHg  Pulse 71  Temp(Src) 97.7 F (36.5 C) (Oral)  Resp 18  SpO2 98% Physical Exam  Constitutional: She is oriented to person, place, and time. She appears well-developed and well-nourished.  Neck: Normal range of motion.  Cardiovascular: Intact distal pulses.   Pulmonary/Chest: Effort normal.  Abdominal: Soft. There is no tenderness.  Musculoskeletal:  Minimal lower lumbar tenderness. No swelling or discoloration of the back. Mild left hip tenderness. FROM LE's with pain on left hip flexion. DTR's equal in bilateral LE's and there is full and equal strength bilaterally.  Neurological: She is alert and oriented to person, place, and time.  Skin: Skin is warm and dry.    ED Course  Procedures (including critical care time) Labs Review Labs Reviewed - No data to display  Imaging Review No results found.   EKG Interpretation None      MDM   Final diagnoses:  None    1. Left hip pain  Pain poorly controlled with Percocet and Flexeril.  Ibuprofen ordered. Attempted ambulation; patient is having significant pain with weight bearing. Will x-ray the pelvis.   Patient care transferred to [facility], PA-C, pending pelvis x-rays.    Charlann Lange, PA-C 03/03/15 6160  Everlene Balls, MD 03/03/15 [phone]

## 2015-03-03 NOTE — Discharge Instructions (Signed)
Hip Pain Your hip is the joint between your upper legs and your lower pelvis. The bones, cartilage, tendons, and muscles of your hip joint perform a lot of work each day supporting your body weight and allowing you to move around. Hip pain can range from a minor ache to severe pain in one or both of your hips. Pain may be felt on the inside of the hip joint near the groin, or the outside near the buttocks and upper thigh. You may have swelling or stiffness as well.  HOME CARE INSTRUCTIONS   Take medicines only as directed by your health care provider.  Apply ice to the injured area:  Put ice in a plastic bag.  Place a towel between your skin and the bag.  Leave the ice on for 15-20 minutes at a time, 3-4 times a day.  Keep your leg raised (elevated) when possible to lessen swelling.  Avoid activities that cause pain.  Follow specific exercises as directed by your health care provider.  Sleep with a pillow between your legs on your most comfortable side.  Record how often you have hip pain, the location of the pain, and what it feels like. SEEK MEDICAL CARE IF:   You are unable to put weight on your leg.  Your hip is red or swollen or very tender to touch.  Your pain or swelling continues or worsens after 1 week.  You have increasing difficulty walking.  You have a fever. SEEK IMMEDIATE MEDICAL CARE IF:   You have fallen.  You have a sudden increase in pain and swelling in your hip. MAKE SURE YOU:   Understand these instructions.  Will watch your condition.  Will get help right away if you are not doing well or get worse. Document Released: 02/21/2010 Document Revised: 01/18/2014 Document Reviewed: 04/30/2013 [employer] Patient Information 2015 [employer]. This information is not intended to replace advice given to you by your health care provider. Make sure you discuss any questions you have with your health care provider.  

## 2015-03-03 NOTE — ED Notes (Signed)
Pt ambulated with limp but steady gait.

## 2016-01-18 ENCOUNTER — Other Ambulatory Visit: Payer: Self-pay | Admitting: Obstetrics and Gynecology

## 2016-01-18 DIAGNOSIS — R928 Other abnormal and inconclusive findings on diagnostic imaging of breast: Secondary | ICD-10-CM

## 2016-01-27 ENCOUNTER — Ambulatory Visit
Admission: RE | Admit: 2016-01-27 | Discharge: 2016-01-27 | Disposition: A | Discharge status: Home / Self Care | Payer: BLUE CROSS/BLUE SHIELD | Source: Ambulatory Visit | Attending: Obstetrics and Gynecology | Admitting: Obstetrics and Gynecology

## 2016-01-27 DIAGNOSIS — R928 Other abnormal and inconclusive findings on diagnostic imaging of breast: Secondary | ICD-10-CM

## 2016-06-10 IMAGING — CR DG PELVIS 1-2V
1 series · 1 of 1 positions shown · non-contrast
Comparison: 01/17/2007

CLINICAL DATA: Fell [REDACTED]. Onset of severe pelvis and left
hip pain on [REDACTED].

EXAM:
PELVIS - 1-2 VIEW

[t pelvis ap]
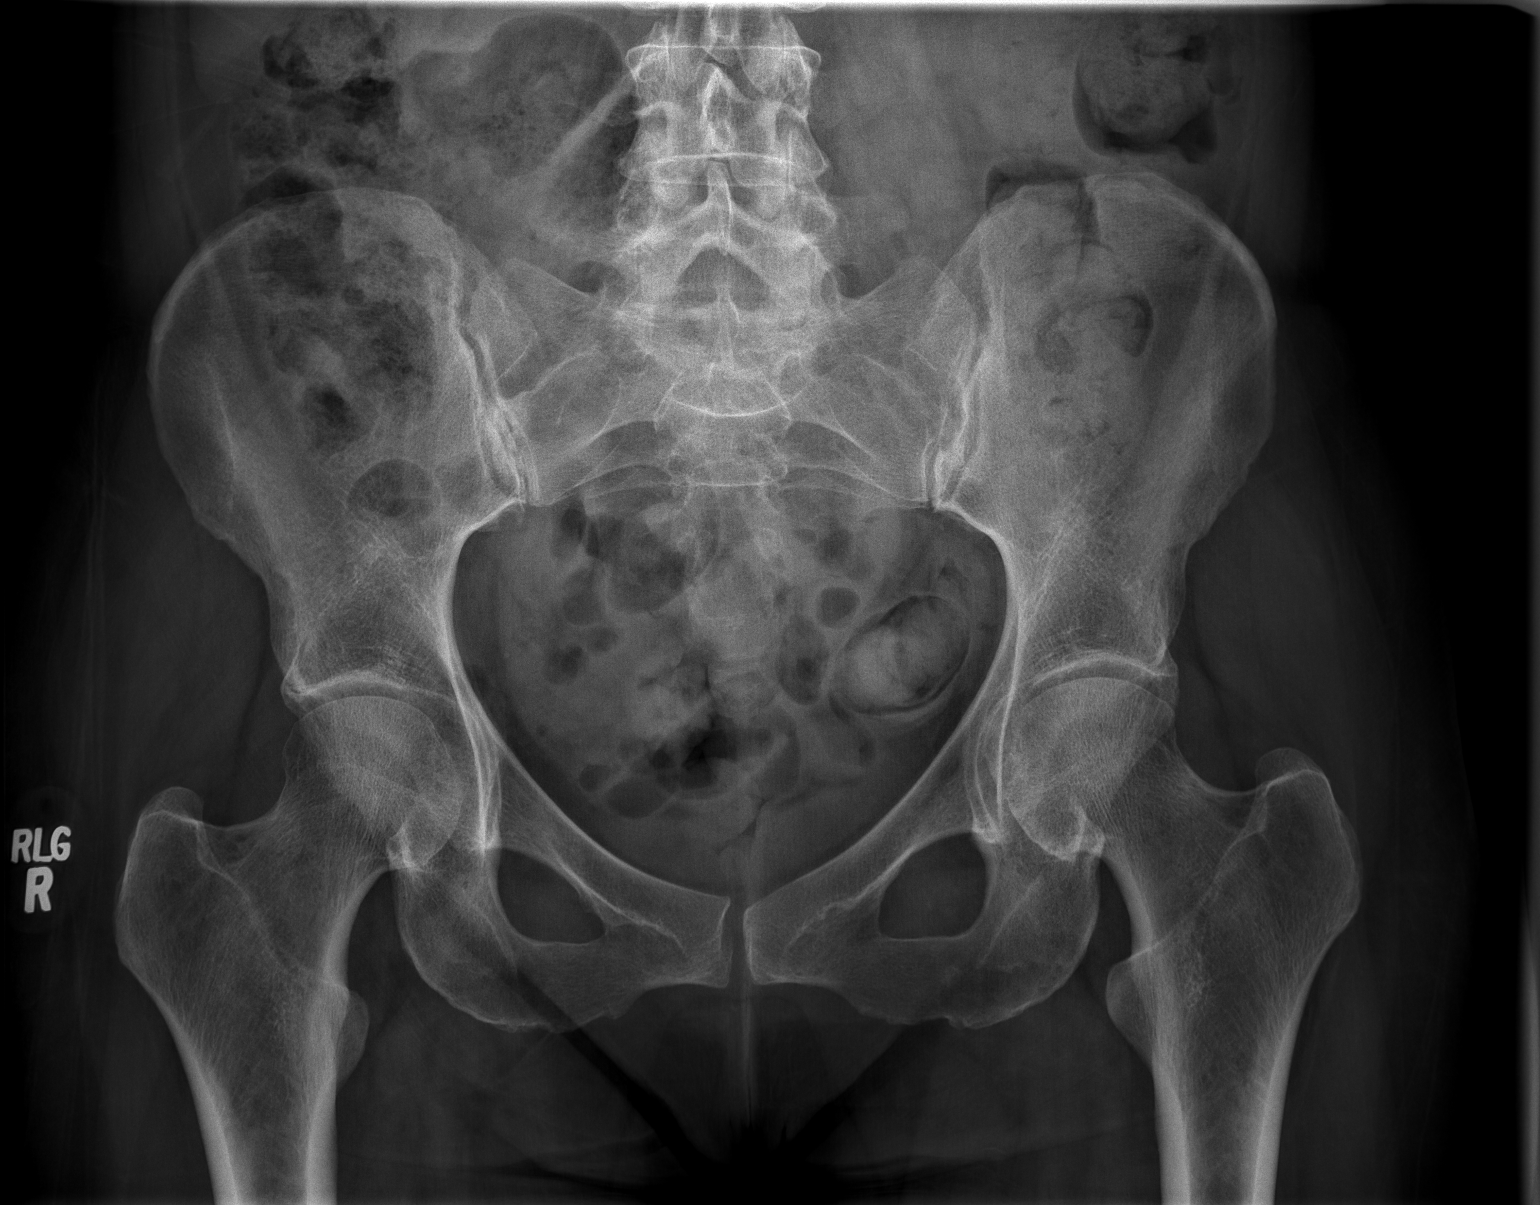

[1 of 1 positions shown; findings below may reference images not displayed]

FINDINGS: Degenerative changes in the hips. Pelvis and hips appear intact. No
evidence of acute fracture or dislocation. SI joints and symphysis
pubis are not displaced.
IMPRESSION: Degenerative changes in the hips.  No acute bony abnormalities.

## 2016-09-15 ENCOUNTER — Emergency Department (HOSPITAL_BASED_OUTPATIENT_CLINIC_OR_DEPARTMENT_OTHER): Payer: BLUE CROSS/BLUE SHIELD

## 2016-09-15 ENCOUNTER — Emergency Department (HOSPITAL_BASED_OUTPATIENT_CLINIC_OR_DEPARTMENT_OTHER)
Admission: EM | Admit: 2016-09-15 | Discharge: 2016-09-15 | Disposition: A | Discharge status: Home / Self Care | Payer: BLUE CROSS/BLUE SHIELD | Attending: Emergency Medicine | Admitting: Emergency Medicine

## 2016-09-15 ENCOUNTER — Encounter (HOSPITAL_BASED_OUTPATIENT_CLINIC_OR_DEPARTMENT_OTHER): Payer: Self-pay

## 2016-09-15 ENCOUNTER — Emergency Department (HOSPITAL_COMMUNITY)
Admission: EM | Admit: 2016-09-15 | Discharge: 2016-09-15 | Discharge status: Absent without leave | Payer: BLUE CROSS/BLUE SHIELD

## 2016-09-15 DIAGNOSIS — W1839XA Other fall on same level, initial encounter: Secondary | ICD-10-CM | POA: Insufficient documentation

## 2016-09-15 DIAGNOSIS — Y999 Unspecified external cause status: Secondary | ICD-10-CM | POA: Diagnosis not present

## 2016-09-15 DIAGNOSIS — Y929 Unspecified place or not applicable: Secondary | ICD-10-CM | POA: Insufficient documentation

## 2016-09-15 DIAGNOSIS — Z79899 Other long term (current) drug therapy: Secondary | ICD-10-CM | POA: Insufficient documentation

## 2016-09-15 DIAGNOSIS — S6992XA Unspecified injury of left wrist, hand and finger(s), initial encounter: Secondary | ICD-10-CM | POA: Diagnosis present

## 2016-09-15 DIAGNOSIS — Y939 Activity, unspecified: Secondary | ICD-10-CM | POA: Insufficient documentation

## 2016-09-15 DIAGNOSIS — F17210 Nicotine dependence, cigarettes, uncomplicated: Secondary | ICD-10-CM | POA: Diagnosis not present

## 2016-09-15 DIAGNOSIS — S52532A Colles' fracture of left radius, initial encounter for closed fracture: Secondary | ICD-10-CM

## 2016-09-15 DIAGNOSIS — W19XXXA Unspecified fall, initial encounter: Secondary | ICD-10-CM

## 2016-09-15 MED ORDER — OXYCODONE-ACETAMINOPHEN 5-325 MG PO TABS
1.0000 | ORAL_TABLET | Freq: Once | ORAL | Status: AC
Start: 2016-09-15 — End: 2016-09-15
  Administered 2016-09-15: 1 via ORAL
  Filled 2016-09-15: qty 1

## 2016-09-15 MED ORDER — OXYCODONE-ACETAMINOPHEN 5-325 MG PO TABS
1.0000 | ORAL_TABLET | Freq: Four times a day (QID) | ORAL | 0 refills | Status: DC | PRN
Start: 2016-09-15 — End: 2023-11-15

## 2016-09-15 NOTE — ED Provider Notes (Addendum)
[facility] Provider Note: Georgena Spurling, MD, FACEP  CSN: [id number] MRN: [id number] ARRIVAL: 09/15/16 at [place]: [place]  Wrist Injury   HISTORY OF PRESENT ILLNESS  ANTWANETTE VERDUSCO is a 60 y.o. female who fell about an hour and a half ago at a party this morning. She fell onto her outstretched left hand. She has severe pain and deformity of the left wrist. Pain is worse with movement or palpation. There is no numbness or weakness distal to the injury. She denies other injury. She applied ice to the wrist prior to arrival.   Past Medical History:  Diagnosis Date  . ADD (attention deficit disorder)   . Anxiety   . Breast abscess    left  . Depression   . GERD (gastroesophageal reflux disease)   . Hyperlipemia   . Insomnia   . Personal history of colonic polyps - hyperplastic only on colonoscopy [id number] 12/12/2011  . Tendonitis     Past Surgical History:  Procedure Laterality Date  . ABDOMINAL HYSTERECTOMY  1989  . BREAST BIOPSY  2001  . DEBRIDEMENT TENNIS ELBOW  2007?   left  . lasik  2001  . liver laceration  1976  . NASAL STENOSIS REPAIR  1976  . THYROID CYST EXCISION  1970    Family History  Problem Relation Age of Onset  . COPD Mother   . Lung cancer Father   . Heart disease      Social History  Substance Use Topics  . Smoking status: Current Every Day Smoker    Packs/day: 12.00    Types: Cigarettes  . Smokeless tobacco: Never Used  . Alcohol use 0.0 oz/week    3 - 4 Glasses of wine per week     Comment: socially    Prior to Admission medications   Medication Sig Start Date End Date Taking? Authorizing Provider  ALPRAZolam Duanne Moron) 0.5 MG tablet Take 0.5 mg by mouth at bedtime as needed for anxiety.    Historical Provider, MD  CRESTOR 20 MG tablet Take 20 mg by mouth daily. 02/04/15   Historical Provider, MD  cyclobenzaprine (FLEXERIL) 10 MG tablet Take 1 tablet (10 mg total) by mouth 2 (two) times daily as needed  for muscle spasms. 03/03/15   Charlann Lange, PA-C  escitalopram (LEXAPRO) 10 MG tablet Take 10 mg by mouth daily.     Historical Provider, MD  lamoTRIgine (LAMICTAL) 200 MG tablet Take 200 mg by mouth daily.      Historical Provider, MD  methylphenidate (RITALIN) 20 MG tablet Take 20 mg by mouth as needed (to help focus).     Historical Provider, MD  oxyCODONE-acetaminophen (PERCOCET/ROXICET) 5-325 MG tablet Take 1-2 tablets by mouth every 6 (six) hours as needed for severe pain. 09/15/16   Shanon Rosser, MD    Allergies Penicillins   REVIEW OF SYSTEMS  Negative except as noted here or in the History of Present Illness.   PHYSICAL EXAMINATION  Initial Vital Signs Blood pressure 118/77, pulse 80, temperature 97.7 F (36.5 C), temperature source Oral, resp. rate 18, SpO2 99 %.  Examination General: Well-developed, well-nourished female in no acute distress; appearance consistent with age of record HENT: normocephalic; atraumatic Eyes: pupils equal, round and reactive to light; extraocular muscles intact Neck: supple; no C-spine tenderness Heart: regular rate and rhythm Lungs: clear to auscultation bilaterally Abdomen: soft; nondistended; nontender; bowel sounds present Extremities: Tenderness and mild deformity of left wrist with decreased range of  motion; left hand distally neurovascularly intact with intact tendon function; radial pulses +2 bilaterally Neurologic: Awake, alert and oriented; motor function intact in all extremities and symmetric; no facial droop Skin: Warm and dry Psychiatric: Flat affect   RESULTS  Summary of this visit's results, reviewed by myself:   EKG Interpretation  Date/Time:    Ventricular Rate:    PR Interval:    QRS Duration:   QT Interval:    QTC Calculation:   R Axis:     Text Interpretation:        Laboratory Studies: No results found for this or any previous visit (from the past 24 hour(s)). Imaging Studies: Dg Wrist Complete  Left  Result Date: 09/15/2016 CLINICAL DATA:  Left wrist pain after fall hip EXAM: LEFT WRIST - COMPLETE 3+ VIEW COMPARISON:  None. FINDINGS: Acute, closed, dorsally angulated metaphyseal fracture of the left radius with intra-articular extension into the distal radioulnar joint. Soft tissue swelling about the wrist. Carpal bones are intact. IMPRESSION: Acute, closed, dorsally angulated intra-articular fracture of the distal radial metaphysis. Fracture extends into the distal radioulnar joint. Soft tissue swelling is noted. Electronically Signed   By: Ashley Royalty M.D.   On: 09/15/2016 02:56    ED COURSE  Nursing notes and initial vitals signs, including pulse oximetry, reviewed.  Vitals:   09/15/16 0203  BP: 118/77  Pulse: 80  Resp: 18  Temp: 97.7 F (36.5 C)  TempSrc: Oral  SpO2: 99%   3:06 AM Patient placed in sugar tong splint and sling. She will follow-up with Dr. Berenice Primas, her orthopedist.  PROCEDURES    ED DIAGNOSES     ICD-9-CM ICD-10-CM   1. Fall, initial encounter [phone] W19.XXXA   2. Closed Colles' fracture of left radius, initial encounter 813.41 S52.532A        Shanon Rosser, MD 09/15/16 0302    Shanon Rosser, MD 09/15/16 [id number]

## 2016-09-15 NOTE — ED Triage Notes (Signed)
Pt states fell on lt hand, deformity to wristt

## 2017-03-28 ENCOUNTER — Other Ambulatory Visit: Payer: Self-pay | Admitting: Obstetrics and Gynecology

## 2017-03-28 DIAGNOSIS — Z1231 Encounter for screening mammogram for malignant neoplasm of breast: Secondary | ICD-10-CM

## 2017-04-12 ENCOUNTER — Ambulatory Visit
Admission: RE | Admit: 2017-04-12 | Discharge: 2017-04-12 | Disposition: A | Discharge status: Home / Self Care | Payer: BLUE CROSS/BLUE SHIELD | Source: Ambulatory Visit | Attending: Obstetrics and Gynecology | Admitting: Obstetrics and Gynecology

## 2017-04-12 DIAGNOSIS — Z1231 Encounter for screening mammogram for malignant neoplasm of breast: Secondary | ICD-10-CM

## 2017-12-24 IMAGING — CR DG WRIST COMPLETE 3+V*L*
3 series · 3 of 3 positions shown · non-contrast
Comparison: None.

CLINICAL DATA: Left wrist pain after fall hip

EXAM:
LEFT WRIST - COMPLETE 3+ VIEW

[x wrist pa left]
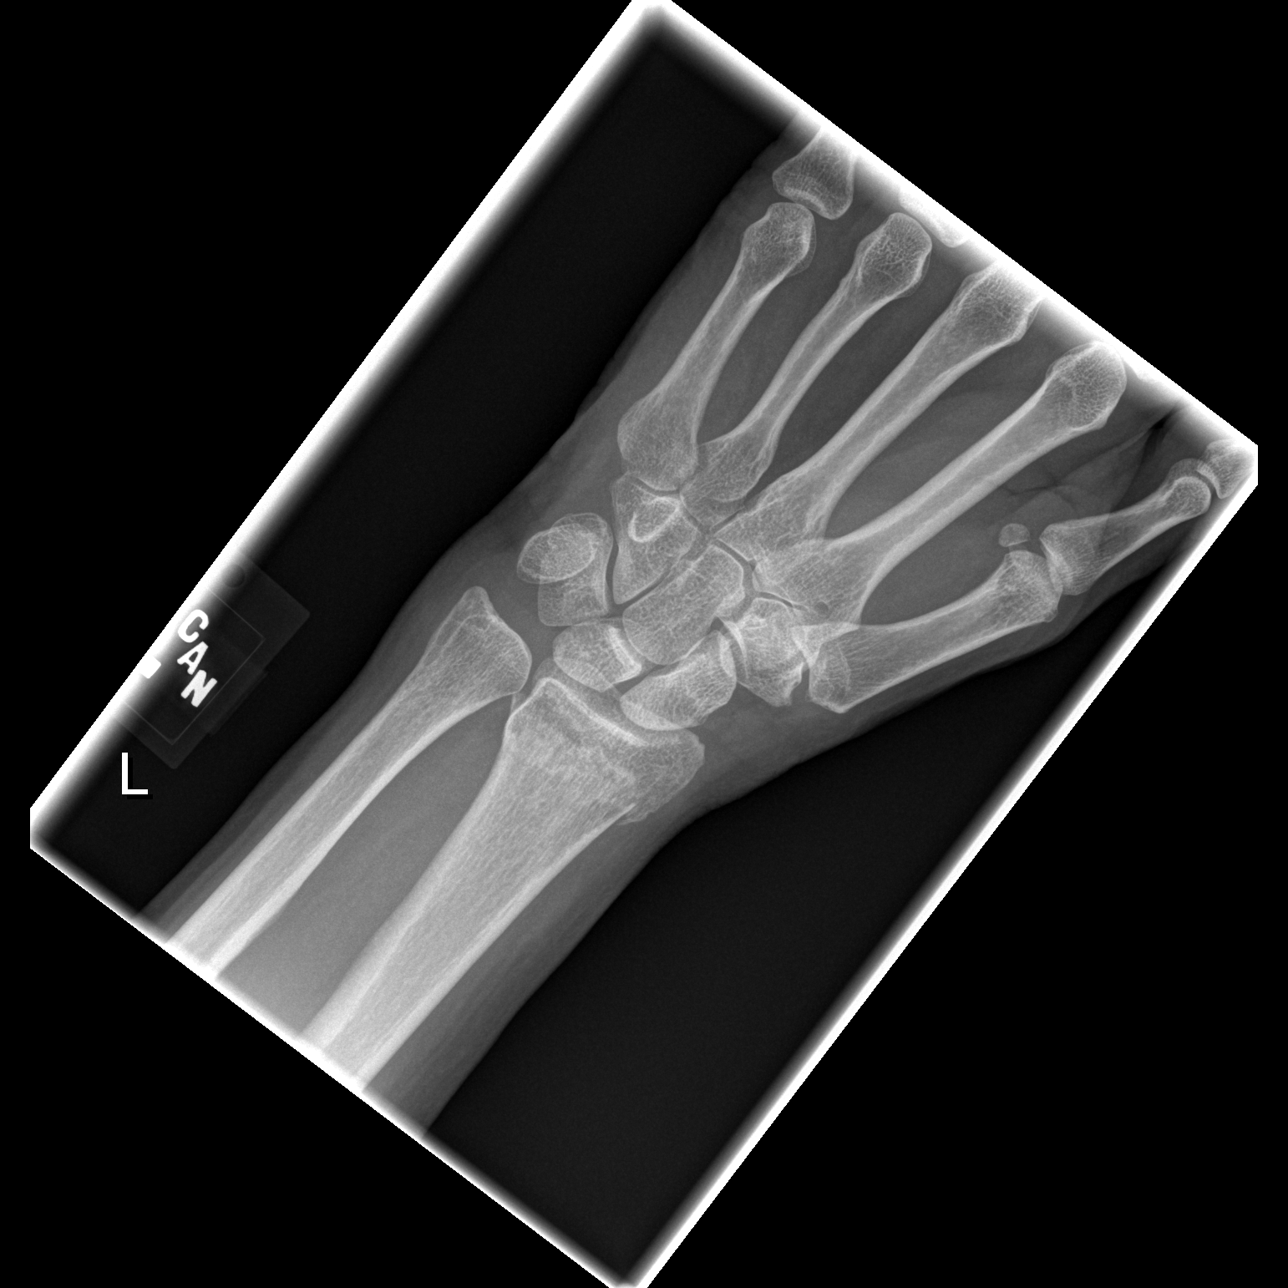

[x wrist obl left]
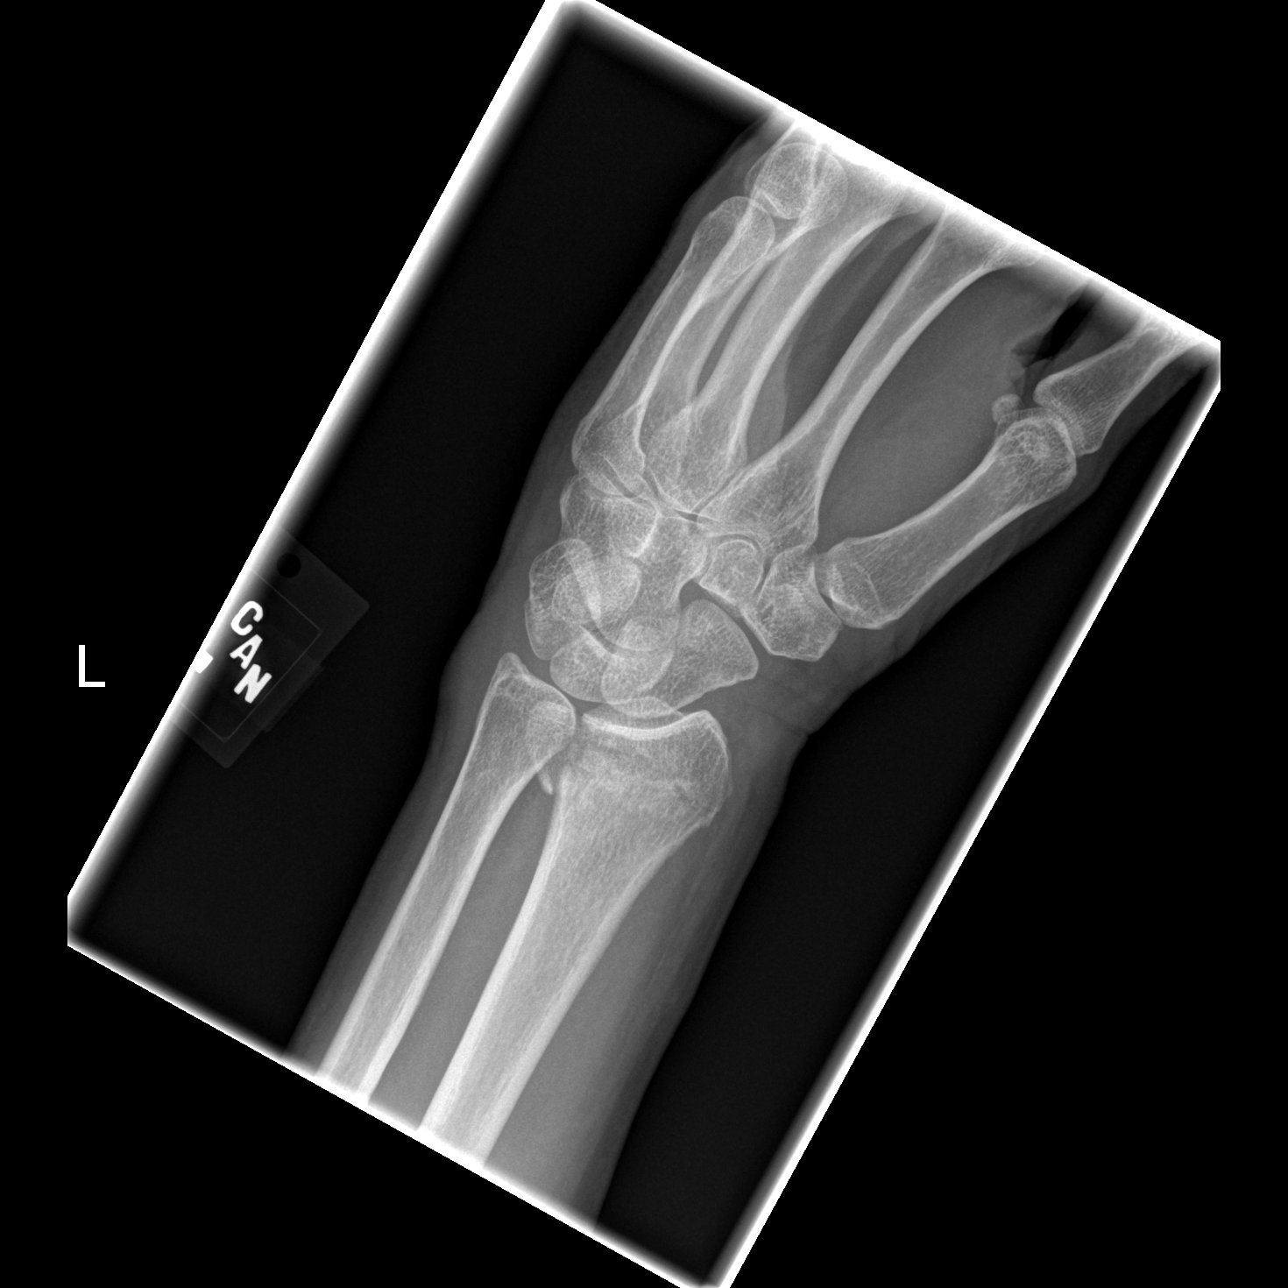

[x wrist lat left]
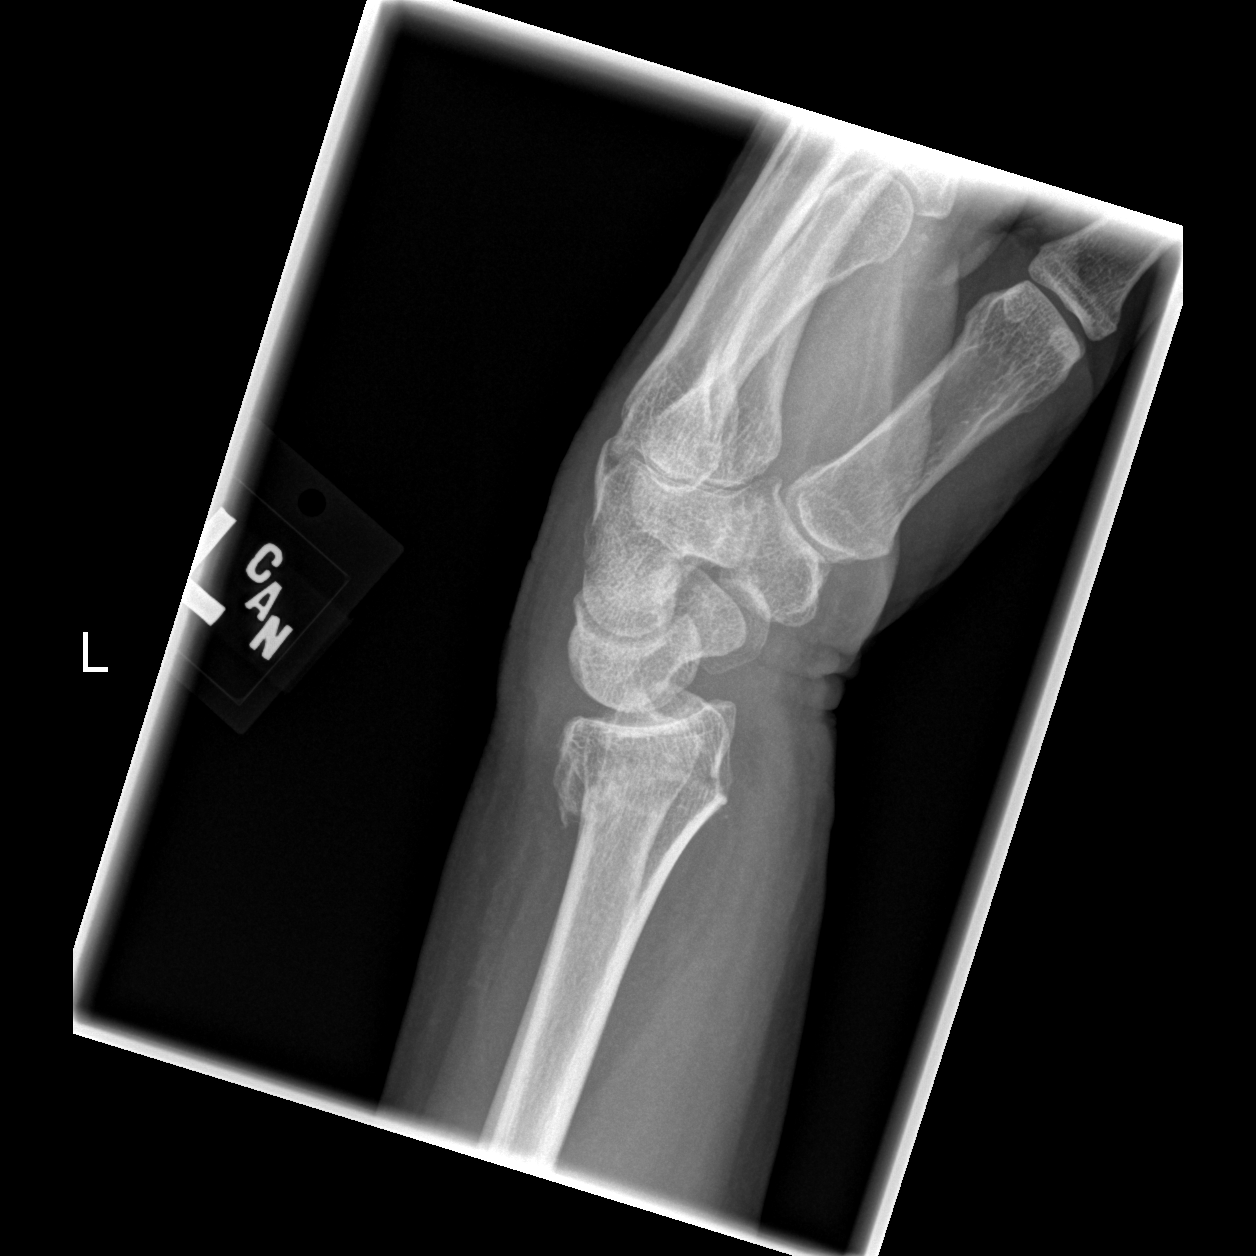

[3 of 3 positions shown; findings below may reference images not displayed]

FINDINGS: Acute, closed, dorsally angulated metaphyseal fracture of the left
radius with intra-articular extension into the distal radioulnar
joint. Soft tissue swelling about the wrist. Carpal bones are
intact.
IMPRESSION: Acute, closed, dorsally angulated intra-articular fracture of the
distal radial metaphysis. Fracture extends into the distal
radioulnar joint. Soft tissue swelling is noted.

## 2018-08-18 ENCOUNTER — Other Ambulatory Visit: Payer: Self-pay | Admitting: Obstetrics and Gynecology

## 2018-08-18 DIAGNOSIS — Z1231 Encounter for screening mammogram for malignant neoplasm of breast: Secondary | ICD-10-CM

## 2018-09-29 ENCOUNTER — Ambulatory Visit
Admission: RE | Admit: 2018-09-29 | Discharge: 2018-09-29 | Disposition: A | Discharge status: Home / Self Care | Payer: PRIVATE HEALTH INSURANCE | Source: Ambulatory Visit | Attending: Obstetrics and Gynecology | Admitting: Obstetrics and Gynecology

## 2018-09-29 DIAGNOSIS — Z1231 Encounter for screening mammogram for malignant neoplasm of breast: Secondary | ICD-10-CM

## 2019-10-07 ENCOUNTER — Other Ambulatory Visit: Payer: Self-pay | Admitting: Obstetrics and Gynecology

## 2019-10-07 DIAGNOSIS — Z1231 Encounter for screening mammogram for malignant neoplasm of breast: Secondary | ICD-10-CM

## 2019-10-08 ENCOUNTER — Other Ambulatory Visit: Payer: Self-pay

## 2019-10-08 ENCOUNTER — Ambulatory Visit
Admission: RE | Admit: 2019-10-08 | Discharge: 2019-10-08 | Disposition: A | Discharge status: Home / Self Care | Payer: PRIVATE HEALTH INSURANCE | Source: Ambulatory Visit | Attending: Obstetrics and Gynecology | Admitting: Obstetrics and Gynecology

## 2019-10-08 DIAGNOSIS — Z1231 Encounter for screening mammogram for malignant neoplasm of breast: Secondary | ICD-10-CM

## 2019-10-09 ENCOUNTER — Other Ambulatory Visit: Payer: Self-pay | Admitting: Obstetrics and Gynecology

## 2019-10-09 DIAGNOSIS — N63 Unspecified lump in unspecified breast: Secondary | ICD-10-CM

## 2019-10-15 ENCOUNTER — Ambulatory Visit
Admission: RE | Admit: 2019-10-15 | Discharge: 2019-10-15 | Disposition: A | Discharge status: Home / Self Care | Payer: PRIVATE HEALTH INSURANCE | Source: Ambulatory Visit | Attending: Obstetrics and Gynecology | Admitting: Obstetrics and Gynecology

## 2019-10-15 ENCOUNTER — Other Ambulatory Visit: Payer: Self-pay

## 2019-10-15 DIAGNOSIS — N63 Unspecified lump in unspecified breast: Secondary | ICD-10-CM

## 2020-11-11 ENCOUNTER — Other Ambulatory Visit: Payer: Self-pay | Admitting: Obstetrics and Gynecology

## 2020-11-11 DIAGNOSIS — Z0000 Encounter for general adult medical examination without abnormal findings: Secondary | ICD-10-CM

## 2021-01-02 ENCOUNTER — Other Ambulatory Visit: Payer: Self-pay

## 2021-01-02 ENCOUNTER — Ambulatory Visit
Admission: RE | Admit: 2021-01-02 | Discharge: 2021-01-02 | Disposition: A | Discharge status: Home / Self Care | Payer: PRIVATE HEALTH INSURANCE | Source: Ambulatory Visit | Attending: Obstetrics and Gynecology | Admitting: Obstetrics and Gynecology

## 2021-01-02 DIAGNOSIS — Z0000 Encounter for general adult medical examination without abnormal findings: Secondary | ICD-10-CM

## 2021-12-11 ENCOUNTER — Other Ambulatory Visit: Payer: Self-pay | Admitting: Obstetrics and Gynecology

## 2021-12-11 DIAGNOSIS — Z1231 Encounter for screening mammogram for malignant neoplasm of breast: Secondary | ICD-10-CM

## 2021-12-26 ENCOUNTER — Encounter: Payer: Self-pay | Admitting: Internal Medicine

## 2022-01-03 ENCOUNTER — Ambulatory Visit: Payer: PRIVATE HEALTH INSURANCE

## 2022-04-12 IMAGING — MG MM DIGITAL SCREENING BILAT W/ TOMO AND CAD
8 series · 8 of 24 positions shown · non-contrast
Comparison: Previous exam(s).

CLINICAL DATA: Screening.

EXAM:
DIGITAL SCREENING BILATERAL MAMMOGRAM WITH TOMOSYNTHESIS AND CAD
TECHNIQUE: Bilateral screening digital craniocaudal and mediolateral oblique
mammograms were obtained. Bilateral screening digital breast
tomosynthesis was performed. The images were evaluated with
computer-aided detection.

[L CC synth-2D]
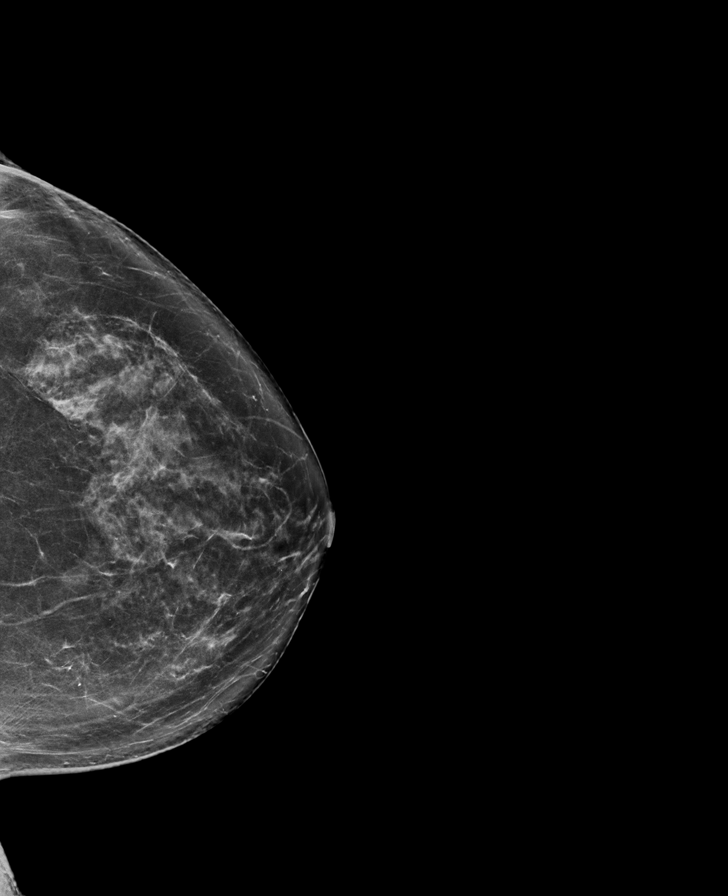

[R MLO synth-2D]
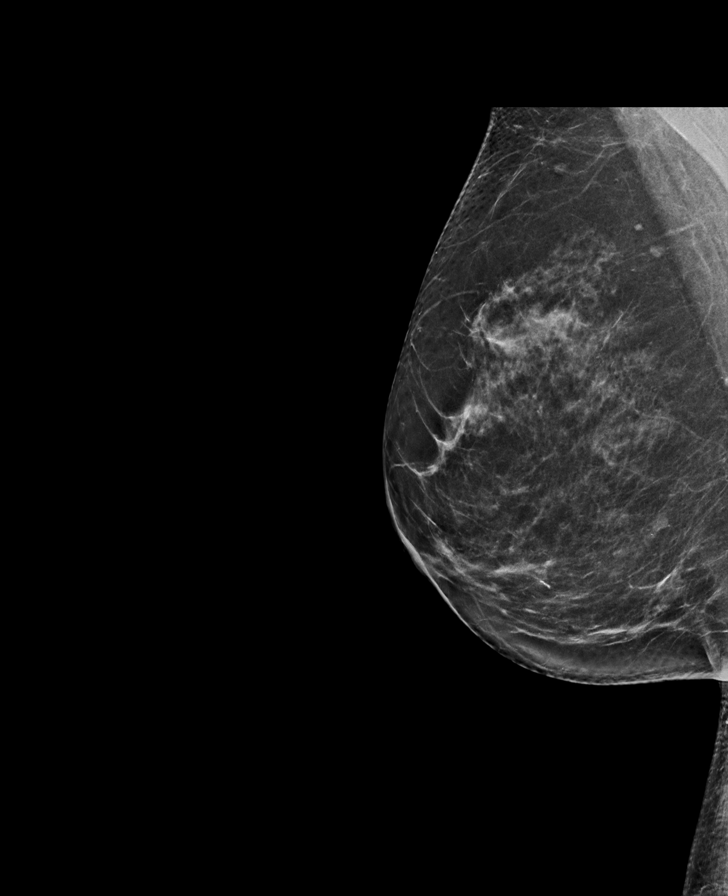

[R CC synth-2D]
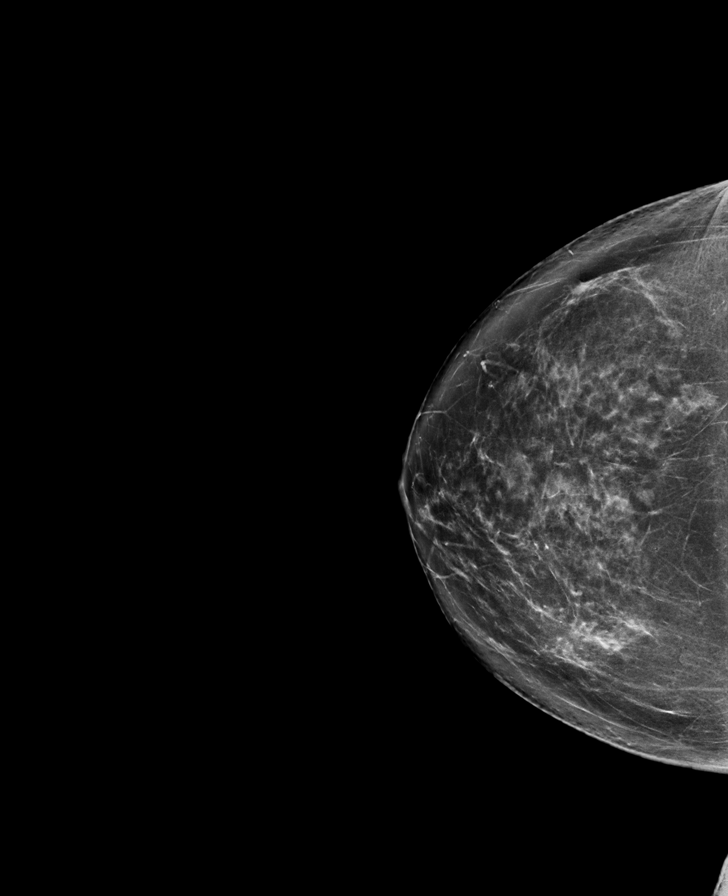

[L MLO synth-2D]
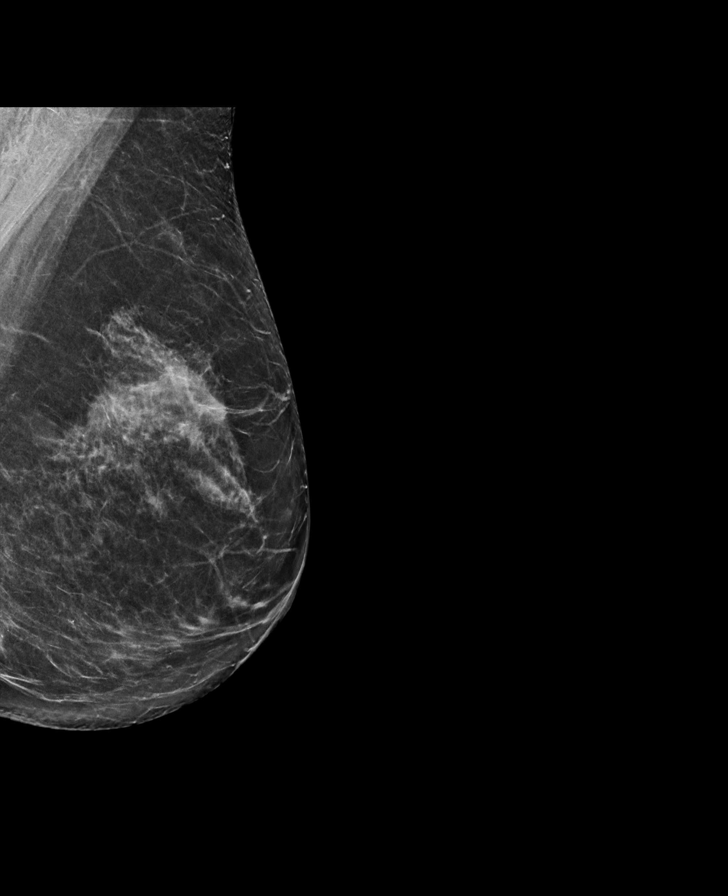

[R CC tomo · tomo slice 42/83.0]
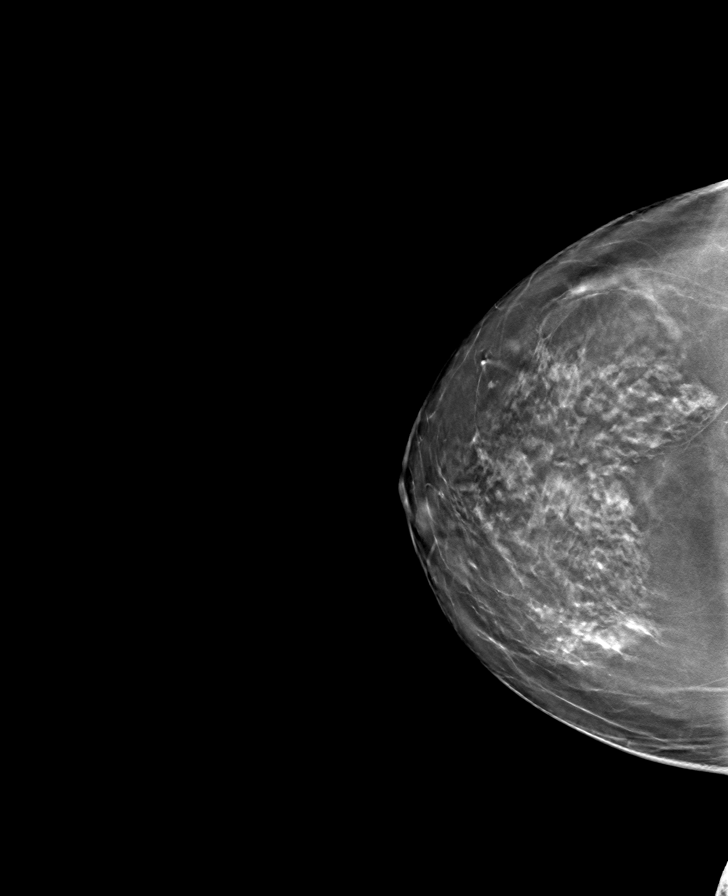

[L CC tomo · tomo slice 39/77.0]
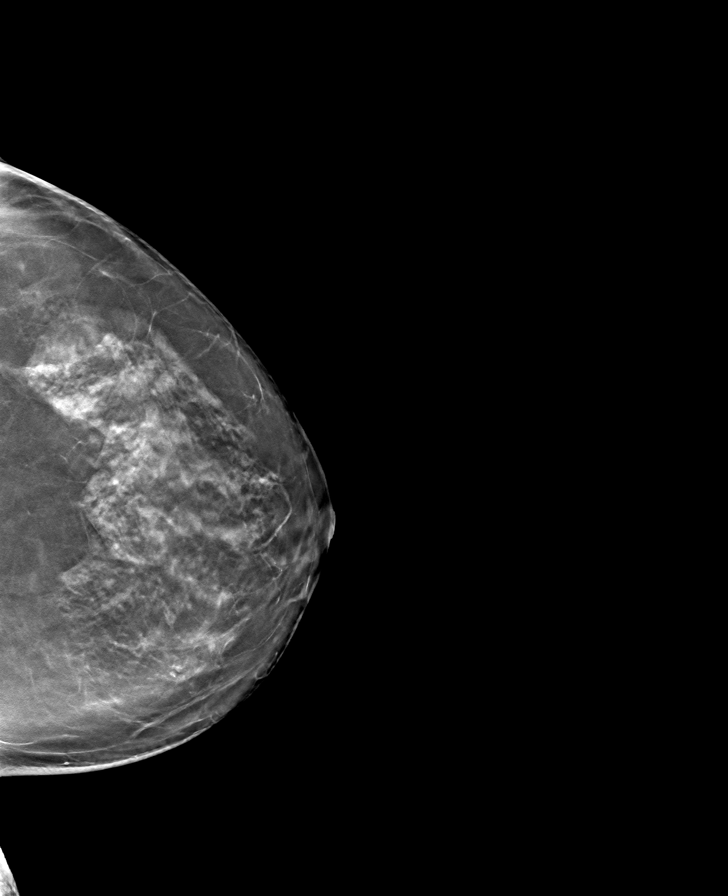

[L MLO tomo · tomo slice 37/74.0]
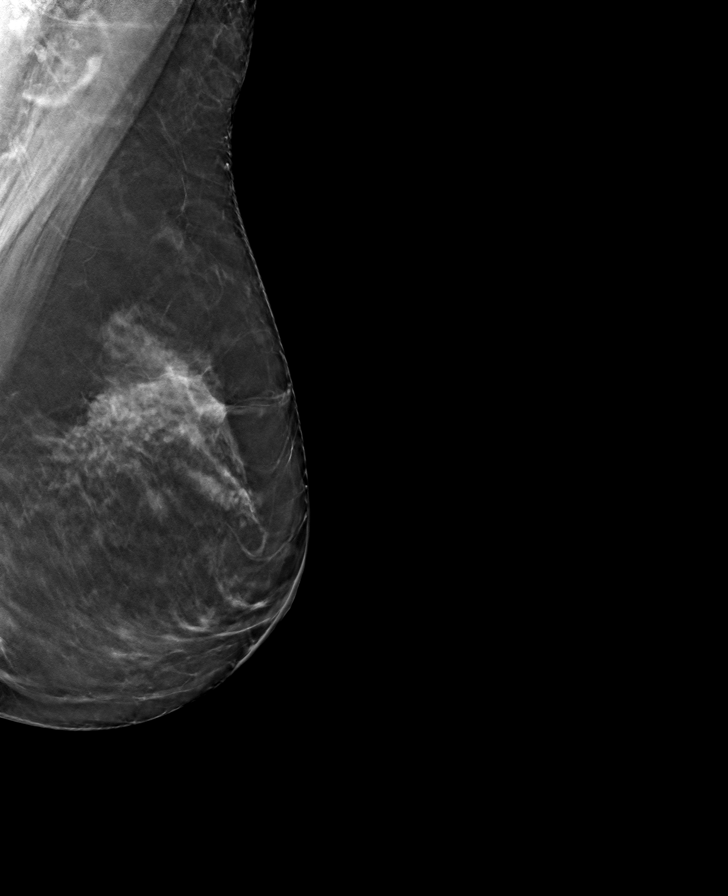

[R MLO tomo · tomo slice 35/70.0]
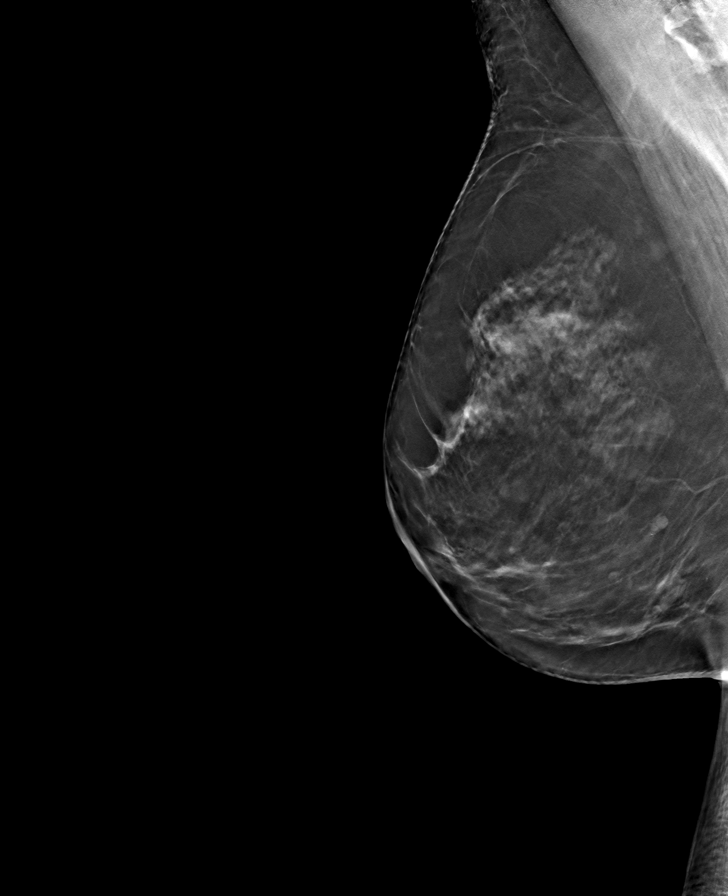

[8 of 24 positions shown; findings below may reference images not displayed]

ACR Breast Density Category c: The breast tissue is heterogeneously
dense, which may obscure small masses.
FINDINGS: There are no findings suspicious for malignancy. The images were
evaluated with computer-aided detection.
IMPRESSION: No mammographic evidence of malignancy. A result letter of this
screening mammogram will be mailed directly to the patient.

RECOMMENDATION:
Screening mammogram in one year. (Code:T4-5-GWO)

BI-RADS CATEGORY  1: Negative.

## 2022-04-18 ENCOUNTER — Other Ambulatory Visit: Payer: Self-pay | Admitting: Internal Medicine

## 2022-04-18 DIAGNOSIS — R1310 Dysphagia, unspecified: Secondary | ICD-10-CM

## 2022-04-18 DIAGNOSIS — Z720 Tobacco use: Secondary | ICD-10-CM

## 2022-04-20 ENCOUNTER — Other Ambulatory Visit: Payer: Self-pay | Admitting: Internal Medicine

## 2022-04-20 DIAGNOSIS — Z720 Tobacco use: Secondary | ICD-10-CM

## 2022-04-27 ENCOUNTER — Ambulatory Visit
Admission: RE | Admit: 2022-04-27 | Discharge: 2022-04-27 | Disposition: A | Discharge status: Home / Self Care | Payer: Medicare Other | Source: Ambulatory Visit | Attending: Obstetrics and Gynecology | Admitting: Obstetrics and Gynecology

## 2022-04-27 DIAGNOSIS — Z1231 Encounter for screening mammogram for malignant neoplasm of breast: Secondary | ICD-10-CM

## 2022-05-02 ENCOUNTER — Ambulatory Visit
Admission: RE | Admit: 2022-05-02 | Discharge: 2022-05-02 | Disposition: A | Discharge status: Home / Self Care | Payer: Medicare Other | Source: Ambulatory Visit | Attending: Internal Medicine | Admitting: Internal Medicine

## 2022-05-02 DIAGNOSIS — R1310 Dysphagia, unspecified: Secondary | ICD-10-CM

## 2022-05-02 DIAGNOSIS — Z720 Tobacco use: Secondary | ICD-10-CM

## 2022-05-08 ENCOUNTER — Encounter: Payer: Self-pay | Admitting: Internal Medicine

## 2022-05-17 ENCOUNTER — Ambulatory Visit
Admission: RE | Admit: 2022-05-17 | Discharge: 2022-05-17 | Disposition: A | Discharge status: Home / Self Care | Payer: Medicare Other | Source: Ambulatory Visit | Attending: Internal Medicine | Admitting: Internal Medicine

## 2022-05-17 ENCOUNTER — Other Ambulatory Visit: Payer: Self-pay | Admitting: Internal Medicine

## 2022-05-17 DIAGNOSIS — Z720 Tobacco use: Secondary | ICD-10-CM

## 2022-05-29 ENCOUNTER — Ambulatory Visit (AMBULATORY_SURGERY_CENTER): Payer: Self-pay | Admitting: *Deleted

## 2022-05-29 VITALS — Ht 69.0 in | Wt 151.6 lb

## 2022-05-29 DIAGNOSIS — Z1211 Encounter for screening for malignant neoplasm of colon: Secondary | ICD-10-CM

## 2022-05-29 MED ORDER — NA SULFATE-K SULFATE-MG SULF 17.5-3.13-1.6 GM/177ML PO SOLN: 1.0000 | Freq: Once | ORAL | 0 refills | Status: AC

## 2022-05-29 NOTE — Progress Notes (Signed)
No egg or soy allergy known to patient  No issues known to pt with past sedation with any surgeries or procedures Patient denies ever being told they had issues or difficulty with intubation  No FH of Malignant Hyperthermia Pt is not on diet pills Pt is not on home 02  Pt is not on blood thinners  Pt denies issues with constipation  No A fib or A flutter Have any cardiac testing pending--NO Pt instructed to use [email or web address] or GoodRx for a price reduction on prep   

## 2022-06-25 ENCOUNTER — Ambulatory Visit (AMBULATORY_SURGERY_CENTER): Payer: Medicare Other | Admitting: Internal Medicine

## 2022-06-25 ENCOUNTER — Encounter: Payer: Self-pay | Admitting: Internal Medicine

## 2022-06-25 ENCOUNTER — Telehealth: Payer: Self-pay | Admitting: Internal Medicine

## 2022-06-25 VITALS — BP 118/64 | HR 72 | Temp 97.5°F | Resp 16 | Ht 69.0 in | Wt 151.6 lb

## 2022-06-25 DIAGNOSIS — D122 Benign neoplasm of ascending colon: Secondary | ICD-10-CM | POA: Diagnosis not present

## 2022-06-25 DIAGNOSIS — D124 Benign neoplasm of descending colon: Secondary | ICD-10-CM | POA: Diagnosis not present

## 2022-06-25 DIAGNOSIS — D125 Benign neoplasm of sigmoid colon: Secondary | ICD-10-CM

## 2022-06-25 DIAGNOSIS — D123 Benign neoplasm of transverse colon: Secondary | ICD-10-CM | POA: Diagnosis not present

## 2022-06-25 DIAGNOSIS — Z1211 Encounter for screening for malignant neoplasm of colon: Secondary | ICD-10-CM | POA: Diagnosis not present

## 2022-06-25 MED ORDER — SODIUM CHLORIDE 0.9 % IV SOLN: 500.0000 mL | Freq: Once | INTRAVENOUS | Status: DC

## 2022-06-25 NOTE — Op Note (Signed)
[facility] Patient Name: Meredith Morgan Procedure Date: 06/25/2022 10:33 AM MRN: [id number] Endoscopist: Docia Chuck. Henrene Pastor , MD Age: 66 Referring MD:  Date of Birth: 04-27-1956 Gender: Female Account #: [id number] Procedure:                Colonoscopy with cold snare polypectomy x 4 Indications:              Screening for colorectal malignant neoplasm. Index                            exam 2013 was negative for neoplasia Medicines:                Monitored Anesthesia Care Procedure:                Pre-Anesthesia Assessment:                           - Prior to the procedure, a History and Physical                            was performed, and patient medications and                            allergies were reviewed. The patient's tolerance of                            previous anesthesia was also reviewed. The risks                            and benefits of the procedure and the sedation                            options and risks were discussed with the patient.                            All questions were answered, and informed consent                            was obtained. Prior Anticoagulants: The patient has                            taken no previous anticoagulant or antiplatelet                            agents. ASA Grade Assessment: II - A patient with                            mild systemic disease. After reviewing the risks                            and benefits, the patient was deemed in                            satisfactory condition to undergo the procedure.  After obtaining informed consent, the colonoscope                            was passed under direct vision. Throughout the                            procedure, the patient's blood pressure, pulse, and                            oxygen saturations were monitored continuously. The                            Olympus CF-HQ190L (Serial# [id number]) Colonoscope was                             introduced through the anus and advanced to the the                            cecum, identified by appendiceal orifice and                            ileocecal valve. The ileocecal valve, appendiceal                            orifice, and rectum were photographed. The quality                            of the bowel preparation was excellent. The                            colonoscopy was performed without difficulty. The                            patient tolerated the procedure well. The bowel                            preparation used was SUPREP via split dose                            instruction. Scope In: 10:40:02 AM Scope Out: 11:00:40 AM Scope Withdrawal Time: 0 hours 16 minutes 26 seconds  Total Procedure Duration: 0 hours 20 minutes 38 seconds  Findings:                 Four polyps were found in the sigmoid colon,                            descending colon, transverse colon and ascending                            colon. The polyps were 2 to 4 mm in size. These                            polyps were removed with a cold snare. Resection  and retrieval were complete.                           Multiple diverticula were found in the sigmoid                            colon.                           The exam was otherwise without abnormality on                            direct and retroflexion views. Complications:            No immediate complications. Estimated blood loss:                            None. Estimated Blood Loss:     Estimated blood loss: none. Impression:               - Four 2 to 4 mm polyps in the sigmoid colon, in                            the descending colon, in the transverse colon and                            in the ascending colon, removed with a cold snare.                            Resected and retrieved.                           - Diverticulosis in the sigmoid colon.                           - The  examination was otherwise normal on direct                            and retroflexion views. Recommendation:           - Repeat colonoscopy in 5 years for surveillance.                           - Patient has a contact number available for                            emergencies. The signs and symptoms of potential                            delayed complications were discussed with the                            patient. Return to normal activities tomorrow.                            Written discharge instructions were provided to the  patient.                           - Resume previous diet.                           - Continue present medications.                           - Await pathology results. Docia Chuck. Henrene Pastor, MD 06/25/2022 11:07:50 AM This report has been signed electronically.

## 2022-06-25 NOTE — Telephone Encounter (Signed)
Inbound call from patient states she tried to finish her prep this morning for procedure but threw it back up. Please advise.

## 2022-06-25 NOTE — Progress Notes (Signed)
Report to PACU, RN, vss, BBS= Clear.  

## 2022-06-25 NOTE — Progress Notes (Signed)
Called to room to assist during endoscopic procedure.  Patient ID and intended procedure confirmed with present staff. Received instructions for my participation in the procedure from the performing physician.  

## 2022-06-25 NOTE — Progress Notes (Signed)
Pt. Reports no change in her medical or surgical history since pre-visit 05/29/2022.

## 2022-06-25 NOTE — Progress Notes (Signed)
HISTORY OF PRESENT ILLNESS:  Meredith Morgan is a 66 y.o. female who presents today for routine screening colonoscopy.  Index exam 2013 was negative for neoplasia  REVIEW OF SYSTEMS:  All non-GI ROS negative. Past Medical History:  Diagnosis Date   ADD (attention deficit disorder)    Anxiety    Blood transfusion without reported diagnosis    1976   Breast abscess    left   Cataract    BILATERAL,REMOVED JULY 2023   Depression    GERD (gastroesophageal reflux disease)    Hyperlipemia    Insomnia    PAC (premature atrial contraction)    20 YEARS AGO   Personal history of colonic polyps - hyperplastic only on colonoscopy [id number] 12/12/2011   Tendonitis     Past Surgical History:  Procedure Laterality Date   ABDOMINAL HYSTERECTOMY  1989   BREAST BIOPSY  2001   CATARACTS Bilateral    JULY 2023   COLONOSCOPY     DEBRIDEMENT TENNIS ELBOW  2007?   left   lasik  2001   liver laceration  1976   NASAL STENOSIS REPAIR  1976   THYROID CYST EXCISION  1970   WRIST FRACTURE SURGERY Left    2016    Social History KINDA POTTLE  reports that she has been smoking cigarettes. She has been smoking an average of 12 packs per day. She has been exposed to tobacco smoke. She has never used smokeless tobacco. She reports current alcohol use of about 3.0 - 4.0 standard drinks of alcohol per week. She reports that she does not use drugs.  family history includes COPD in her mother; Heart disease in an other family member; Lung cancer in her father.  Allergies  Allergen Reactions   Penicillins Rash       PHYSICAL EXAMINATION: Vital signs: BP 101/63   Pulse 65   Temp (!) 97.5 F (36.4 C)   Ht '5\' 9"'$  (1.753 m)   Wt 151 lb 9.6 oz (68.8 kg)   SpO2 96%   BMI 22.39 kg/m  General: Well-developed, well-nourished, no acute distress HEENT: Sclerae are anicteric, conjunctiva pink. Oral mucosa intact Lungs: Clear Heart: Regular Abdomen: soft, nontender, nondistended, no obvious  ascites, no peritoneal signs, normal bowel sounds. No organomegaly. Extremities: No edema Psychiatric: alert and oriented x3. Cooperative      ASSESSMENT:  Colon cancer screening   PLAN:  Screening colonoscopy

## 2022-06-25 NOTE — Patient Instructions (Signed)
-   Repeat colonoscopy in 5 years for surveillance. - Patient has a contact number available for emergencies. The signs and symptoms of potential delayed complications were discussed with the patient. Return to normal activities tomorrow. Written discharge instructions were provided to the patient. - Resume previous diet. - Continue present medications. - Await pathology results. -Handout on diverticulosis and polyps provided  YOU HAD AN ENDOSCOPIC PROCEDURE TODAY AT [place]:   Refer to the procedure report that was given to you for any specific questions about what was found during the examination.  If the procedure report does not answer your questions, please call your gastroenterologist to clarify.  If you requested that your care partner not be given the details of your procedure findings, then the procedure report has been included in a sealed envelope for you to review at your convenience later.  YOU SHOULD EXPECT: Some feelings of bloating in the abdomen. Passage of more gas than usual.  Walking can help get rid of the air that was put into your GI tract during the procedure and reduce the bloating. If you had a lower endoscopy (such as a colonoscopy or flexible sigmoidoscopy) you may notice spotting of blood in your stool or on the toilet paper. If you underwent a bowel prep for your procedure, you may not have a normal bowel movement for a few days.  Please Note:  You might notice some irritation and congestion in your nose or some drainage.  This is from the oxygen used during your procedure.  There is no need for concern and it should clear up in a day or so.  SYMPTOMS TO REPORT IMMEDIATELY:  Following lower endoscopy (colonoscopy or flexible sigmoidoscopy):  Excessive amounts of blood in the stool  Significant tenderness or worsening of abdominal pains  Swelling of the abdomen that is new, acute  Fever of 100F or higher   For urgent or emergent issues, a  gastroenterologist can be reached at any hour by calling [phone]. Do not use MyChart messaging for urgent concerns.    DIET:  We do recommend a small meal at first, but then you may proceed to your regular diet.  Drink plenty of fluids but you should avoid alcoholic beverages for 24 hours.  ACTIVITY:  You should plan to take it easy for the rest of today and you should NOT DRIVE or use heavy machinery until tomorrow (because of the sedation medicines used during the test).    FOLLOW UP: Our staff will call the number listed on your records the next business day following your procedure.  We will call around 7:15- 8:00 am to check on you and address any questions or concerns that you may have regarding the information given to you following your procedure. If we do not reach you, we will leave a message.     If any biopsies were taken you will be contacted by phone or by letter within the next 1-3 weeks.  Please call us at [phone] if you have not heard about the biopsies in 3 weeks.    SIGNATURES/CONFIDENTIALITY: You and/or your care partner have signed paperwork which will be entered into your electronic medical record.  These signatures attest to the fact that that the information above on your After Visit Summary has been reviewed and is understood.  Full responsibility of the confidentiality of this discharge information lies with you and/or your care-partner.

## 2022-06-25 NOTE — Telephone Encounter (Signed)
Spoke with pt. She is already on the way to her appointment. States her BMs are liquid and clear. Will plan to proceed with procedure.

## 2022-06-26 ENCOUNTER — Telehealth: Payer: Self-pay | Admitting: *Deleted

## 2022-06-26 NOTE — Telephone Encounter (Signed)
Follow up all attempt.  LVM to call if any questions or concerns.

## 2022-07-04 ENCOUNTER — Encounter: Payer: Self-pay | Admitting: Internal Medicine

## 2023-07-17 ENCOUNTER — Encounter: Payer: Self-pay | Admitting: Internal Medicine

## 2023-08-08 ENCOUNTER — Other Ambulatory Visit: Payer: Self-pay | Admitting: Obstetrics and Gynecology

## 2023-08-08 DIAGNOSIS — Z1231 Encounter for screening mammogram for malignant neoplasm of breast: Secondary | ICD-10-CM

## 2023-09-16 ENCOUNTER — Ambulatory Visit: Payer: Medicare Other | Admitting: Cardiology

## 2023-09-17 ENCOUNTER — Ambulatory Visit: Payer: Medicare Other

## 2023-09-20 ENCOUNTER — Ambulatory Visit
Admission: RE | Admit: 2023-09-20 | Discharge: 2023-09-20 | Disposition: A | Discharge status: Home / Self Care | Payer: Medicare Other | Source: Ambulatory Visit | Attending: Obstetrics and Gynecology | Admitting: Obstetrics and Gynecology

## 2023-09-20 DIAGNOSIS — Z1231 Encounter for screening mammogram for malignant neoplasm of breast: Secondary | ICD-10-CM

## 2023-09-24 ENCOUNTER — Other Ambulatory Visit: Payer: Self-pay | Admitting: Internal Medicine

## 2023-09-24 DIAGNOSIS — Z720 Tobacco use: Secondary | ICD-10-CM

## 2023-10-17 ENCOUNTER — Ambulatory Visit
Admission: RE | Admit: 2023-10-17 | Discharge: 2023-10-17 | Disposition: A | Discharge status: Home / Self Care | Payer: Medicare Other | Source: Ambulatory Visit | Attending: Internal Medicine | Admitting: Internal Medicine

## 2023-10-17 DIAGNOSIS — Z720 Tobacco use: Secondary | ICD-10-CM

## 2023-11-14 NOTE — Progress Notes (Unsigned)
  Cardiology Office Note:  .   Date:  11/15/2023  ID:  Meredith Morgan, DOB [birth year], MRN [id number] PCP: Garlan Fillers, MD  [facility] Providers Cardiologist:  None    History of Present Illness: .   Meredith Morgan is a 68 y.o. female with episoes of orthostatic hypotension  Has had them for years  3-4 months ago, she had an episode of syncope following an episode of orthostatic hypotension She was walking to pick up her phone but had syncope and passed out   Admits to not eating well  Does not drink much water   Has minor episodes frequently   ROS:   Studies Reviewed: Marland Kitchen   EKG Interpretation Date/Time:  Friday November 15 2023 10:05:27 EST Ventricular Rate:  82 PR Interval:  152 QRS Duration:  82 QT Interval:  372 QTC Calculation: 434 R Axis:   91  Text Interpretation: Sinus rhythm with marked sinus arrhythmia Rightward axis No previous ECGs available Confirmed by Kristeen Miss ([id number]) on 11/15/2023 10:13:16 AM     Risk Assessment/Calculations:             Physical Exam:   VS:  BP 98/62   Pulse 82   Ht 5\' 9"  (1.753 m)   Wt 150 lb 6.4 oz (68.2 kg)   SpO2 95%   BMI 22.21 kg/m    Wt Readings from Last 3 Encounters:  11/15/23 150 lb 6.4 oz (68.2 kg)  06/25/22 151 lb 9.6 oz (68.8 kg)  05/29/22 151 lb 9.6 oz (68.8 kg)    GEN: Well nourished, well developed in no acute distress NECK: No JVD; No carotid bruits CARDIAC: RRR, no murmurs, rubs, gallops RESPIRATORY:  Clear to auscultation without rales, wheezing or rhonchi  ABDOMEN: Soft, non-tender, non-distended EXTREMITIES:  No edema; No deformity   ASSESSMENT AND PLAN: .     Orthostatic hypotension:   has had for years . She had an episode of syncope when she tried to make to a chair after she was already having orthostatic hypotension.  She admits that she does not eat or drink regularly.  I have encouraged her to eat 3 meals a day including protein in each meal.  I encouraged her to add some  electrolytes to her drinking water into drink more fluids through the day.  I recommended that she use Morton's lite salt which contain sodium and potassium.  Will get an echocardiogram to evaluate her LV function.  Will see her back on an as-needed basis.       Dispo: PRN   Signed, Kristeen Miss, MD

## 2023-11-15 ENCOUNTER — Ambulatory Visit: Payer: Medicare Other | Attending: Cardiovascular Disease | Admitting: Cardiovascular Disease

## 2023-11-15 ENCOUNTER — Encounter: Payer: Self-pay | Admitting: Cardiovascular Disease

## 2023-11-15 VITALS — BP 98/62 | HR 82 | Ht 69.0 in | Wt 150.4 lb

## 2023-11-15 DIAGNOSIS — R55 Syncope and collapse: Secondary | ICD-10-CM | POA: Insufficient documentation

## 2023-11-15 DIAGNOSIS — Z0000 Encounter for general adult medical examination without abnormal findings: Secondary | ICD-10-CM | POA: Diagnosis present

## 2023-11-15 NOTE — Patient Instructions (Signed)
 Medication Instructions:  Your physician recommends that you continue on your current medications as directed. Please refer to the Current Medication list given to you today.  *If you need a refill on your cardiac medications before your next appointment, please call your pharmacy*  Testing/Procedures: Your physician has requested that you have an echocardiogram. Echocardiography is a painless test that uses sound waves to create images of your heart. It provides your doctor with information about the size and shape of your heart and how well your heart's chambers and valves are working. This procedure takes approximately one hour. There are no restrictions for this procedure. Please do NOT wear cologne, perfume, aftershave, or lotions (deodorant is allowed). Please arrive 15 minutes prior to your appointment time.  Please note: We ask at that you not bring children with you during ultrasound (echo/ vascular) testing. Due to room size and safety concerns, children are not allowed in the ultrasound rooms during exams. Our front office staff cannot provide observation of children in our lobby area while testing is being conducted. An adult accompanying a patient to their appointment will only be allowed in the ultrasound room at the discretion of the ultrasound technician under special circumstances. We apologize for any inconvenience.  Follow-Up: At [facility], you and your health needs are our priority.  As part of our continuing mission to provide you with exceptional heart care, we have created designated Provider Care Teams.  These Care Teams include your primary Cardiologist (physician) and Advanced Practice Providers (APPs -  Physician Assistants and Nurse Practitioners) who all work together to provide you with the care you need, when you need it.  Your next appointment:   As needed  The format for your next appointment:   In Person  Provider:   Kristeen Miss, MD {  Other  Instructions   1st Floor: - Lobby - Registration  - Pharmacy  - Lab - Cafe  2nd Floor: - PV Lab - Diagnostic Testing (echo, CT, nuclear med)  3rd Floor: - Vacant  4th Floor: - TCTS (cardiothoracic surgery) - AFib Clinic - Structural Heart Clinic - Vascular Surgery  - Vascular Ultrasound  5th Floor: - HeartCare Cardiology (general and EP) - Clinical Pharmacy for coumadin, hypertension, lipid, weight-loss medications, and med management appointments    Valet parking services will be available as well.

## 2023-12-10 ENCOUNTER — Ambulatory Visit (HOSPITAL_COMMUNITY): Payer: Medicare Other | Attending: Cardiovascular Disease

## 2023-12-10 DIAGNOSIS — R55 Syncope and collapse: Secondary | ICD-10-CM | POA: Diagnosis not present

## 2023-12-10 LAB — ECHOCARDIOGRAM COMPLETE
Area-P 1/2: 3.88 cm2
S' Lateral: 2.7 cm

## 2023-12-12 ENCOUNTER — Encounter: Payer: Self-pay | Admitting: Cardiovascular Disease

## 2024-08-12 ENCOUNTER — Other Ambulatory Visit: Payer: Self-pay | Admitting: Internal Medicine

## 2024-08-12 DIAGNOSIS — Z720 Tobacco use: Secondary | ICD-10-CM

## 2024-08-23 ENCOUNTER — Ambulatory Visit (INDEPENDENT_AMBULATORY_CARE_PROVIDER_SITE_OTHER)

## 2024-08-23 ENCOUNTER — Ambulatory Visit: Admission: EM | Admit: 2024-08-23 | Discharge: 2024-08-23 | Disposition: A | Discharge status: Home / Self Care

## 2024-08-23 DIAGNOSIS — R0789 Other chest pain: Secondary | ICD-10-CM

## 2024-08-23 DIAGNOSIS — K219 Gastro-esophageal reflux disease without esophagitis: Secondary | ICD-10-CM | POA: Insufficient documentation

## 2024-08-23 DIAGNOSIS — F411 Generalized anxiety disorder: Secondary | ICD-10-CM | POA: Insufficient documentation

## 2024-08-23 DIAGNOSIS — J432 Centrilobular emphysema: Secondary | ICD-10-CM | POA: Insufficient documentation

## 2024-08-23 DIAGNOSIS — S2239XA Fracture of one rib, unspecified side, initial encounter for closed fracture: Secondary | ICD-10-CM

## 2024-08-23 DIAGNOSIS — F900 Attention-deficit hyperactivity disorder, predominantly inattentive type: Secondary | ICD-10-CM | POA: Insufficient documentation

## 2024-08-23 DIAGNOSIS — E785 Hyperlipidemia, unspecified: Secondary | ICD-10-CM | POA: Insufficient documentation

## 2024-08-23 DIAGNOSIS — F17200 Nicotine dependence, unspecified, uncomplicated: Secondary | ICD-10-CM | POA: Insufficient documentation

## 2024-08-23 HISTORY — DX: Syncope and collapse: R55

## 2024-08-23 MED ORDER — IBUPROFEN 800 MG PO TABS: 800.0000 mg | ORAL_TABLET | Freq: Three times a day (TID) | ORAL | 0 refills | Status: AC | PRN

## 2024-08-23 MED ORDER — CYCLOBENZAPRINE HCL 5 MG PO TABS: 5.0000 mg | ORAL_TABLET | Freq: Three times a day (TID) | ORAL | 0 refills | Status: AC | PRN

## 2024-08-23 NOTE — Discharge Instructions (Addendum)
 X-ray of the left ribs and chest done today.  Final evaluation by the radiologist does show a minimally displaced left eighth rib fracture.  This will need follow-up in 2 to 3 weeks with another chest x-ray.  Can return to urgent care or go to your primary care provider's office.  At this time recommend ibuprofen  800 mg every 8 hours as needed for pain and can use Flexeril  5 mg every 8 hours as needed for muscle spasms.  Use caution as the Flexeril  can cause drowsiness.  Ice the area 2-3 times daily for 10-15 minutes to help with pain and swelling. Do not apply ice directly to the skin.  Recommend taking deep breaths as shallow breathing can cause a buildup of fluid in the base of the lungs.  Would avoid any type of heavy contact such as contact sports until the area is completely healed.  Can follow-up at urgent care if needed

## 2024-08-23 NOTE — ED Triage Notes (Signed)
 The patient is here with her spouse and reports experiencing back pain. She fell over an [place] chair while assisting her husband with moving furniture. The arm of the chair poked and injured the upper right/middle part of her back. She now feels pain in the left front rib area, particularly when breathing. DOI this morning about 11am.

## 2024-08-23 NOTE — ED Provider Notes (Signed)
 [facility]    CSN: [id number] Arrival date & time: 08/23/24  1144      History   Chief Complaint Chief Complaint  Patient presents with   Back Pain    HPI Meredith Morgan is a 68 y.o. female.   68 year old female who presents urgent care with complaints of left flank pain.  She reports that this occurred after she fell over an [place] chair while helping her husband move furniture.  She reports that her left side hit the edge of the chair.  After this she began having left side/back pain that radiated around to the front.  She was also feeling a popping and crackling sensation around into the posterior side.  The pain is exacerbated by any twisting.  It does get worse when she coughs or takes in deep breaths.  She denies any chest pain or shortness of breath.  She did not suffer any other injuries.   Back Pain Associated symptoms: no abdominal pain, no chest pain, no dysuria and no fever     Past Medical History:  Diagnosis Date   ADD (attention deficit disorder)    Anxiety    Blood transfusion without reported diagnosis    1976   Breast abscess    left   Cataract    BILATERAL,REMOVED JULY 2023   Depression    GERD (gastroesophageal reflux disease)    Hyperlipemia    Insomnia    PAC (premature atrial contraction)    20 YEARS AGO   Personal history of colonic polyps - hyperplastic only on colonoscopy [id number] 12/12/2011   Syncope and collapse 05/2023   Tendonitis     Patient Active Problem List   Diagnosis Date Noted   Gastroesophageal reflux disease without esophagitis 08/23/2024   Attention deficit hyperactivity disorder, predominantly inattentive type 08/23/2024   Hyperlipidemia 08/23/2024   Generalized anxiety disorder 08/23/2024   Centrilobular emphysema (HCC) 08/23/2024   Smoker 08/23/2024   Tobacco abuse 12/12/2011   History of colonic polyps 12/12/2011   GERD (gastroesophageal reflux disease)    Insomnia    Anxiety    ADD (attention  deficit disorder)     Past Surgical History:  Procedure Laterality Date   ABDOMINAL HYSTERECTOMY  1989   BREAST BIOPSY  2001   CATARACTS Bilateral    JULY 2023   COLONOSCOPY     DEBRIDEMENT TENNIS ELBOW  2007?   left   lasik  2001   liver laceration  1976   NASAL STENOSIS REPAIR  1976   THYROID  CYST EXCISION  1970   WRIST FRACTURE SURGERY Left    2016    OB History   No obstetric history on file.      Home Medications    Prior to Admission medications   Medication Sig Start Date End Date Taking? Authorizing Provider  albuterol (VENTOLIN HFA) 108 (90 Base) MCG/ACT inhaler Inhale 2 puffs into the lungs every 4 (four) hours as needed. 08/14/21  Yes [provider]  azithromycin (ZITHROMAX) 250 MG tablet Take 250 mg by mouth as directed. 03/08/22  Yes [provider]  azithromycin (ZITHROMAX) 250 MG tablet Take 250 mg by mouth as directed. 09/03/22  Yes [provider]  CRESTOR 20 MG tablet Take 20 mg by mouth daily. 02/04/15  Yes [provider]  ezetimibe (ZETIA) 10 MG tablet Take 1 tablet by mouth daily.   Yes [provider]  Fluticasone-Umeclidin-Vilant 200-62.5-25 MCG/ACT AEPB 1 puff. 07/14/23  Yes [provider]  lisdexamfetamine (VYVANSE) 30 MG capsule Take 30 mg by mouth daily. 07/28/21  Yes [provider]  omeprazole (PRILOSEC) 20 MG capsule Take 20 mg by mouth daily as needed. 10/11/23  Yes [provider]  ALPRAZolam (XANAX) 0.5 MG tablet Take 0.5 mg by mouth at bedtime as needed for anxiety.    [provider]  DULoxetine (CYMBALTA) 60 MG capsule Take 60 mg by mouth daily.    [provider]  escitalopram (LEXAPRO) 10 MG tablet Take 10 mg by mouth daily.    [provider]  ipratropium (ATROVENT) 0.06 % nasal spray Place 1 spray into both nostrils 2 (two) times daily.    [provider]  lamoTRIgine (LAMICTAL) 100 MG tablet Take 100 mg by mouth daily.     [provider]  Vitamin D, Ergocalciferol, (DRISDOL) 1.25 MG (50000 UNIT) CAPS capsule Take 50,000 Units by mouth once a week. 05/12/22   [provider]    Family History Family History  Problem Relation Age of Onset   COPD Mother    Hyperlipidemia Mother    Hypertension Mother    Lung cancer Father    Heart disease Other    Breast cancer Neg Hx    Colon cancer Neg Hx    Colon polyps Neg Hx    Crohn's disease Neg Hx    Esophageal cancer Neg Hx    Rectal cancer Neg Hx    Stomach cancer Neg Hx    Ulcerative colitis Neg Hx     Social History Social History   Tobacco Use   Smoking status: Every Day    Current packs/day: 12.00    Types: Cigarettes    Passive exposure: Current   Smokeless tobacco: Never  Vaping Use   Vaping status: Never Used  Substance Use Topics   Alcohol use: Yes    Alcohol/week: 3.0 - 4.0 standard drinks of alcohol    Types: 3 - 4 Glasses of wine per week    Comment: socially   Drug use: No     Allergies   Penicillins   Review of Systems Review of Systems  Constitutional:  Negative for chills and fever.  HENT:  Negative for ear pain and sore throat.   Eyes:  Negative for pain and visual disturbance.  Respiratory:  Negative for cough and shortness of breath.   Cardiovascular:  Negative for chest pain and palpitations.  Gastrointestinal:  Negative for abdominal pain and vomiting.  Genitourinary:  Negative for dysuria and hematuria.  Musculoskeletal:  Positive for back pain (left flank/rib pain). Negative for arthralgias.  Skin:  Negative for color change and rash.  Neurological:  Negative for seizures and syncope.  All other systems reviewed and are negative.    Physical Exam Triage Vital Signs ED Triage Vitals  Encounter Vitals Group     BP 08/23/24 1230 116/70     Girls Systolic BP Percentile --      Girls Diastolic BP Percentile --      Boys Systolic BP Percentile --      Boys Diastolic BP Percentile --       Pulse Rate 08/23/24 1230 73     Resp 08/23/24 1230 20     Temp 08/23/24 1230 97.9 F (36.6 C)     Temp Source 08/23/24 1230 Oral     SpO2 08/23/24 1230 95 %     Weight 08/23/24 1228 150 lb 5.7 oz (68.2 kg)     Height 08/23/24 1228 5' 9 (  1.753 m)     Head Circumference --      Peak Flow --      Pain Score 08/23/24 1224 2     Pain Loc --      Pain Education --      Exclude from Growth Chart --    No data found.  Updated Vital Signs BP 116/70 (BP Location: Left Arm)   Pulse 73   Temp 97.9 F (36.6 C) (Oral)   Resp 20   Ht 5' 9 (1.753 m)   Wt 150 lb 5.7 oz (68.2 kg)   SpO2 95%   BMI 22.20 kg/m   Visual Acuity Right Eye Distance:   Left Eye Distance:   Bilateral Distance:    Right Eye Near:   Left Eye Near:    Bilateral Near:     Physical Exam Vitals and nursing note reviewed.  Constitutional:      General: She is not in acute distress.    Appearance: She is well-developed.  HENT:     Head: Normocephalic and atraumatic.  Eyes:     Conjunctiva/sclera: Conjunctivae normal.  Cardiovascular:     Rate and Rhythm: Normal rate and regular rhythm.     Heart sounds: No murmur heard. Pulmonary:     Effort: Pulmonary effort is normal. No respiratory distress.     Breath sounds: Normal breath sounds.  Chest:    Abdominal:     Palpations: Abdomen is soft.     Tenderness: There is no abdominal tenderness.  Musculoskeletal:        General: No swelling.     Cervical back: Neck supple.  Skin:    General: Skin is warm and dry.     Capillary Refill: Capillary refill takes less than 2 seconds.  Neurological:     Mental Status: She is alert.  Psychiatric:        Mood and Affect: Mood normal.      UC Treatments / Results  Labs (all labs ordered are listed, but only abnormal results are displayed) Labs Reviewed - No data to display  EKG   Radiology DG Ribs Unilateral W/Chest Left Result Date: 08/23/2024 EXAM: 1 AP VIEW XRAY OF THE LEFT RIBS AND CHEST  08/23/2024 01:14:11 PM COMPARISON: None available. CLINICAL HISTORY: fall, left flank pain around side around to front, popping sensation FINDINGS: BONES: Mildly displaced fracture involving the lateral portion of left eighth rib. LUNGS AND PLEURA: No consolidation or pulmonary edema. No pleural effusion or pneumothorax. HEART AND MEDIASTINUM: No acute abnormality of the cardiac and mediastinal silhouettes. IMPRESSION: 1. Mildly displaced fracture of the lateral left eighth rib. Electronically signed by: Lynwood Seip MD 08/23/2024 01:34 PM EST RP Workstation: [id number]    Procedures Procedures (including critical care time)  Medications Ordered in UC Medications - No data to display  Initial Impression / Assessment and Plan / UC Course  I have reviewed the triage vital signs and the nursing notes.  Pertinent labs & imaging results that were available during my care of the patient were reviewed by me and considered in my medical decision making (see chart for details).     Fracture of eighth rib  Rib pain on left side - Plan: DG Ribs Unilateral W/Chest Left, DG Ribs Unilateral W/Chest Left   X-ray of the left ribs and chest done today.  Final evaluation by the radiologist does show a minimally displaced left eighth rib fracture.  This will need follow-up in 2 to 3 weeks with  another chest x-ray.  Can return to urgent care or go to your primary care provider's office.  At this time recommend ibuprofen  800 mg every 8 hours as needed for pain and can use Flexeril  5 mg every 8 hours as needed for muscle spasms.  Use caution as the Flexeril  can cause drowsiness.  Ice the area 2-3 times daily for 10-15 minutes to help with pain and swelling. Do not apply ice directly to the skin.  Recommend taking deep breaths as shallow breathing can cause a buildup of fluid in the base of the lungs.  Would avoid any type of heavy contact such as contact sports until the area is completely healed.  Can follow-up at  urgent care if needed  Final Clinical Impressions(s) / UC Diagnoses   Final diagnoses:  Rib pain on left side  Fracture of eighth rib     Discharge Instructions      X-ray of the left ribs and chest done today.  Final evaluation by the radiologist does show a minimally displaced left eighth rib fracture.  This will need follow-up in 2 to 3 weeks with another chest x-ray.  Can return to urgent care or go to your primary care provider's office.  At this time recommend ibuprofen  800 mg every 8 hours as needed for pain and can use Flexeril  5 mg every 8 hours as needed for muscle spasms.  Use caution as the Flexeril  can cause drowsiness.  Ice the area 2-3 times daily for 10-15 minutes to help with pain and swelling. Do not apply ice directly to the skin.  Recommend taking deep breaths as shallow breathing can cause a buildup of fluid in the base of the lungs.  Would avoid any type of heavy contact such as contact sports until the area is completely healed.  Can follow-up at urgent care if needed     ED Prescriptions   None    PDMP not reviewed this encounter.   Teresa Almarie LABOR, PA-C 08/23/24 1347

## 2024-09-02 ENCOUNTER — Other Ambulatory Visit: Payer: Self-pay | Admitting: Internal Medicine

## 2024-09-02 DIAGNOSIS — Z720 Tobacco use: Secondary | ICD-10-CM

## 2024-09-29 ENCOUNTER — Ambulatory Visit
Admission: RE | Admit: 2024-09-29 | Discharge: 2024-09-29 | Disposition: A | Source: Ambulatory Visit | Attending: Internal Medicine | Admitting: Internal Medicine

## 2024-09-29 DIAGNOSIS — Z720 Tobacco use: Secondary | ICD-10-CM
